# Patient Record
Sex: Male | Born: 1970
Health system: Southern US, Community
[De-identification: ages and names within clinical notes are randomized; demographics above are authoritative.]

## PROBLEM LIST (undated history)

## (undated) DIAGNOSIS — F17200 Nicotine dependence, unspecified, uncomplicated: Secondary | ICD-10-CM

## (undated) DIAGNOSIS — Z992 Dependence on renal dialysis: Secondary | ICD-10-CM

## (undated) DIAGNOSIS — E785 Hyperlipidemia, unspecified: Secondary | ICD-10-CM

## (undated) DIAGNOSIS — E119 Type 2 diabetes mellitus without complications: Secondary | ICD-10-CM

## (undated) DIAGNOSIS — K219 Gastro-esophageal reflux disease without esophagitis: Secondary | ICD-10-CM

## (undated) DIAGNOSIS — Z923 Personal history of irradiation: Secondary | ICD-10-CM

## (undated) DIAGNOSIS — K769 Liver disease, unspecified: Secondary | ICD-10-CM

## (undated) DIAGNOSIS — N189 Chronic kidney disease, unspecified: Secondary | ICD-10-CM

## (undated) DIAGNOSIS — I10 Essential (primary) hypertension: Secondary | ICD-10-CM

## (undated) DIAGNOSIS — F99 Mental disorder, not otherwise specified: Secondary | ICD-10-CM

## (undated) DIAGNOSIS — J410 Simple chronic bronchitis: Secondary | ICD-10-CM

## (undated) DIAGNOSIS — F209 Schizophrenia, unspecified: Secondary | ICD-10-CM

## (undated) HISTORY — DX: Hyperlipidemia, unspecified: E78.5

## (undated) HISTORY — DX: Liver disease, unspecified: K76.9

## (undated) HISTORY — DX: Type 2 diabetes mellitus without complications: E11.9

## (undated) HISTORY — DX: Essential (primary) hypertension: I10

## (undated) HISTORY — DX: Chronic kidney disease, unspecified: N18.9

---

## 2003-05-28 ENCOUNTER — Inpatient Hospital Stay (HOSPITAL_COMMUNITY): Admission: EM | Admit: 2003-05-28 | Discharge: 2003-05-29 | Payer: Self-pay | Admitting: Emergency Medicine

## 2003-05-31 ENCOUNTER — Encounter: Admission: RE | Admit: 2003-05-31 | Discharge: 2003-08-29 | Payer: Self-pay | Admitting: Family Medicine

## 2004-08-18 ENCOUNTER — Emergency Department (HOSPITAL_COMMUNITY): Admission: EM | Admit: 2004-08-18 | Discharge: 2004-08-18 | Payer: Self-pay | Admitting: Emergency Medicine

## 2005-03-03 ENCOUNTER — Encounter: Admission: RE | Admit: 2005-03-03 | Discharge: 2005-05-17 | Payer: Self-pay | Admitting: Family Medicine

## 2010-06-08 ENCOUNTER — Encounter: Payer: Self-pay | Admitting: Family Medicine

## 2011-03-03 ENCOUNTER — Other Ambulatory Visit (HOSPITAL_COMMUNITY): Payer: Self-pay

## 2011-03-27 ENCOUNTER — Ambulatory Visit (HOSPITAL_COMMUNITY): Payer: Self-pay | Admitting: Psychology

## 2012-06-01 ENCOUNTER — Telehealth (HOSPITAL_COMMUNITY): Payer: Self-pay

## 2012-06-01 NOTE — Telephone Encounter (Signed)
06/01/12 Recd called from [place]-pt's mother stating that she had legal guardianship of the patient - per the pt's mother he was seen @ [facility] for a long time but on yesterday was told that [facility] no longer accept his insurance.  Pt's mother has Power of Oneta Rack - informed her to bring all legal documents.   Mailed the NP paperwork and appt card./sh

## 2012-06-28 ENCOUNTER — Ambulatory Visit (INDEPENDENT_AMBULATORY_CARE_PROVIDER_SITE_OTHER): Payer: 59 | Admitting: Psychiatry

## 2012-06-28 ENCOUNTER — Encounter (HOSPITAL_COMMUNITY): Payer: Self-pay | Admitting: Psychiatry

## 2012-06-28 VITALS — BP 122/91 | HR 92 | Wt 230.8 lb

## 2012-06-28 DIAGNOSIS — Z79899 Other long term (current) drug therapy: Secondary | ICD-10-CM

## 2012-06-28 DIAGNOSIS — F259 Schizoaffective disorder, unspecified: Secondary | ICD-10-CM

## 2012-06-28 NOTE — Progress Notes (Signed)
Patient ID: Kevin Deleon, male   DOB: Oct 05, 1970, 42 y.o.   MRN: [id number] Chief complaint I have schizoaffective disorder and I want to continue my medication .    History presenting illness Patient is 42 year old Caucasian single unemployed male who came for his appointment.  Patient was seeing Dr. Lovena Le at [facility] until recently find out that his insurance does not cover.  Mother endorse that she was not happy with [facility] anyway I like to change his psychiatrist.  Patient came with his mother who provided most of the information.  Patient is a poor historian.  As per mother patient has a long history of psychiatric illness started at age 20.  He was hospitalized due to psychosis and serious anger issues.  His been seeing at [facility] mental health for past 20 years however there has history of noncompliance with medication resulting at least 4-5 psychiatric admission.  His last psychiatric admission was at old [facility] in October 2012 due to noncompliance with medication resulting in severe psychosis paranoia and hallucination and disorganized thinking.  Patient is taking Risperdal 2 mg from [place].  He was taking 6 mg when he was discharged from old [facility] however it was making him zombie and psychiatrist at [facility] decreased to 2 mg.  As per mother but he takes his medication he does very well.  Patient denies any recent hallucination or any severe mood swing but admitted social isolation, paranoia in public and sometime poor sleep.  Patient denies any side effects of Risperdal however he was note is tapping his foot and sometime restless.  Patient does not want to change his dosage, medication or frequency.  He is also not interested in any counseling or therapy.  His mother lives in [place] and comes once a month for his appointment.  Mother endorse that patient has significant anger problem with his neighbor last year however since patient moved to a new  place 6 months ago his anger is much control.  Patient told that his anger towards his neighbor was do to neighbor drinking alcohol.  Patient denies any recent agitation anger or any mood swing.  He takes his medication however mother endorse that he needs reminders. Mother is also not happy with his primary care physician.  She is in the process of changing his primary care physician and has appointment with endocrinologist tomorrow.  Past psychiatric history Patient started to have psychiatric illness at age 8.  At that time he was hospitalized due to paranoia and disorganized thinking.  Though he denies any is suicidal attempt but admitted significant mood swings anger and hallucination in the past.  He has at least 5 psychiatric hospitalization mostly due to noncompliance of medication and disorganized thinking.  In the past he has taken lithium which was discontinued due to kidney function issues.  He was seeing [facility] for many years until recently changed to [facility].  In the past he had tried Haldol which he actually likes but when the dose was increased to 15 mg he became zombie and stopped.  He likes Risperdal.  In the past he also tried amitriptyline by Dr. Milagros Loll.  Patient has history of mania and psychosis.  Psychosocial history Patient was born in [place] and raised in [place].  His living in [place] since age 72.  Patient is never married.  He has no children.  Patient lives by himself in her apartment.  His mother lives in  [place] with her partner.  Patient has a sister however patient does not get along with her.  Patient has no knowledge about her biological father who he believed lives in [place].  Patient told that his father disowned him.  Patient denies any history of physical sexual verbal or emotional abuse.  Family history Patient endorse uncle has substance use problem.  Education work history Patient has some college  education.  He was working in a Architect until 5 years ago.  He stopped working due to his anger problem.  Alcohol and substance use history Patient admitted history of heavy drinking in his teens.  He has DWI in 1993.  He has used marijuana in the past but denies any current use.  Patient denies drinking however mother does that he had drink on New Year's Eve .  Patient denies any binge drinking.  Medical history Patient has obesity, diabetes mellitus, hypertension and hyperlipidemia.  His primary care physician is Dr. Orpah Greek.  Review of Systems  Constitutional: Negative.   Gastrointestinal: Negative.   Musculoskeletal: Negative.   Neurological: Negative.   Psychiatric/Behavioral: Positive for hallucinations and memory loss. The patient is nervous/anxious.    Mental status examination Patient is mildly obese male who is casually dressed and fairly groomed.  He maintained poor eye contact.  He is superficially cooperative.  He is guarded about his past history and symptoms.  His his speech is pressured and fixated on certain issues.  At times he is rambling and incoherent with increased volume and tone.  His thought processes circumstantial however there were no flight of ideas or any loose association.  He endorse paranoia but does not want to talk in detail about his paranoia.  He does not feel comfortable in public.  He denies any active or passive suicidal thoughts or homicidal thoughts.  He denies any auditory or visual hallucination.  There were no obsession present at this time.  He was noted restless and his psychomotor activity is slightly increased.  He described his mood is okay and his affect is irritable.  His attention and concentration is poor.  He's alert and oriented x3.  His insight judgment and impulse control is fair.  Assessment Axis I schizoaffective disorder bipolar type Axis II deferred Axis III see medical history Axis IV mild to moderate Axis V 50-55  Plan I  talked with the patient about his symptoms, medication and response to the medication.  We talk about psychosocial stressors including his medical history and noncompliance of medication in the past.  Patient remains very guarded and does not want to change his medication dosage or frequency.  He also declined any counseling and therapy at this time.  His mother is very involved in his treatment plan comes once a month from [place].  At this time I will continue Risperdal 2 mg as given by [facility] and patient is still has 2 months worth of medication.  I offer Cogentin to help his restlessness but patient refused.  Patient also refused injectable Risperdal 2 .  Risk and benefit explain in detail.  Recommend to call us if he has any question or concern if he feel worsening of the symptom.  Safety plan discussed that anytime having active suicidal thoughts or homicidal thoughts that he need to call 911 or go to local emergency room.  Patient is scheduled to see endocrinologist tomorrow we will get blood work results.  I will see him again in 4 weeks.  Time spent 60 minutes.

## 2012-07-05 ENCOUNTER — Telehealth (HOSPITAL_COMMUNITY): Payer: Self-pay | Admitting: *Deleted

## 2012-07-05 NOTE — Telephone Encounter (Signed)
She has guardianship, per mother, as of fall 2012,and she asked that this information be given to provider.She states in past, she wrote letters to provider, but that now she has guardianship, she can communicate directly. Mother called with concerns related to patient appt with provider last week. Asked questions about other providers in office, to see if possible for patient to see another MD. Informed her that patients were not usually transferred between providers per clinic policy.  Most of phone call was mother explaining how previous providers have interacted with "Lissa Hoard", and what approach has worked with him in the past. Mother states he does best with a conversational approach, used by his previous provider, instead of direct questions.  She states he does not like or respond well to what she referred to as scripted questions, such as - Are you hearing voices?; Do you have any thoughts of hurting yourself? Patient does not like to come to MD appointments, so mother comes with him. He will always say he is okay in answer to MD questions. He does not like for his mother to bring up or say anything negative in appointments, so she tries not to.  She states he becomes agitated when when therapy, additional medicines or suggestion of changes in medicine is made, so she would prefer if those things are not mentioned. States he does not want to go to therapy at all, ever. She also states he is doing okay on current medication, in past has not taken medicine as ordered, but will take this one.She states she thought he did better on Haldol in past, but patient did not like it and has since refused to take it, so she does not bring it up.  Mother states she lives in [place], but is in process of setting up home health visits for patient, to monitor his blood pressure and blood sugars and to have someone fill his medicine boxes, but she will continue to come to [place] to come to appts in this  clinic.

## 2012-07-26 ENCOUNTER — Ambulatory Visit (HOSPITAL_COMMUNITY): Payer: Self-pay | Admitting: Psychiatry

## 2012-08-09 ENCOUNTER — Ambulatory Visit (INDEPENDENT_AMBULATORY_CARE_PROVIDER_SITE_OTHER): Payer: 59 | Admitting: Psychiatry

## 2012-08-09 ENCOUNTER — Encounter (HOSPITAL_COMMUNITY): Payer: Self-pay | Admitting: Psychiatry

## 2012-08-09 VITALS — BP 113/78 | HR 90 | Wt 231.4 lb

## 2012-08-09 DIAGNOSIS — E785 Hyperlipidemia, unspecified: Secondary | ICD-10-CM | POA: Insufficient documentation

## 2012-08-09 DIAGNOSIS — F259 Schizoaffective disorder, unspecified: Secondary | ICD-10-CM

## 2012-08-09 DIAGNOSIS — I10 Essential (primary) hypertension: Secondary | ICD-10-CM | POA: Insufficient documentation

## 2012-08-09 DIAGNOSIS — E669 Obesity, unspecified: Secondary | ICD-10-CM | POA: Insufficient documentation

## 2012-08-09 DIAGNOSIS — E119 Type 2 diabetes mellitus without complications: Secondary | ICD-10-CM | POA: Insufficient documentation

## 2012-08-09 NOTE — Progress Notes (Signed)
Kevin Deleon  JAHLIL Deleon [id number] 42 y.o.  08/09/2012 2:57 PM  Chief Complaint: I'm taking the medication regularly.  History of Present Illness: Patient is 42 year old Caucasian single unemployed man who came for his followup appointment.  He came with his mother who drove from [place] for his appointment.  Patient was seen first time on 06/28/2012.  Patient is a long history of schizoaffective disorder.  He was seen Dr. Lovena Le at [facility] however due to recent change in his insurance he has to find a different psychiatrist.  Patient remains guarded and withdrawn.  Most of the information was obtained from his mother.  However patient reported that he's been taking his medication regularly.  His mother is trying to help him by getting more services.  Now patient is seen visiting nurse every week and a visiting nurse practitioner every month for medication management.  Patient has not seen his primary care physician Dr. Carole Binning.  As per mother this office as a walk-in office in one day when patient went to see him with the mother, his office was closed.  Patient is comfortable with visiting nurse practitioner.  He has recently blood work and he was told that his creatinine is high however we do not have blood results available.  Mother is also happy with the nurse practitioner.  Patient denies any recent agitation anger mood swing.  He stays to himself most of the time.  He does not leave his place unless necessary.  He sleeping better.  He is taking Risperdal 2 mg at bedtime.  Patient has no issues with his neighbor.  He's not drinking or using any illegal substance.  Suicidal Ideation: No Plan Formed: No Patient has means to carry out plan: No  Homicidal Ideation: No Plan Formed: No Patient has means to carry out plan: No  Review of Systems: Psychiatric: Agitation: No Hallucination: No Depressed Mood: No Insomnia: No Hypersomnia: No Altered  Concentration: No Feels Worthless: No Grandiose Ideas: Yes Belief In Special Powers: No New/Increased Substance Abuse: No Compulsions: No  Neurologic: Headache: No Seizure: No Paresthesias: No  Medical History:  Patient has history of obesity, diabetes mellitus, hypertension and hyperlipidemia.  He is seeing visiting nurse practitioner.  Psychosocial history. Patient was born in [place] and raised in [place].  He is living in [place] since age 14.  Patient never married.  He has no children.  Patient lives by himself in his apartment.  His mother lives in [place] with her partner.  Patient has a sister however patient does not get along with her.  Patient has no knowledge about his biological father patient told that his father disowned him.  Alcohol and substance use history. Patient admitted history of heavy drinking in his teens.  He has DWI in 1983.  He has used marijuana in the past but denies any current use.  Education work history. Patient has some college education.  He was working in Architect until 5 years ago.  He stopped working due to his anger problem.  Outpatient Encounter Prescriptions as of 08/09/2012  Medication Sig Dispense Refill  . LANTUS SOLOSTAR 100 UNIT/ML injection       . lisinopril-hydrochlorothiazide (PRINZIDE,ZESTORETIC) 20-12.5 MG per tablet       . metFORMIN (GLUCOPHAGE) 1000 MG tablet Take 1,000 mg by mouth 2 (two) times daily.      . risperiDONE (RISPERDAL) 2 MG tablet Take 2 mg by mouth at bedtime.      Marland Kitchen  simvastatin (ZOCOR) 20 MG tablet Take 20 mg by mouth daily.       No facility-administered encounter medications on file as of 08/09/2012.    Past Psychiatric History/Hospitalization(s): Patient started to have psychiatric illness at age 67. At that time he was hospitalized due to paranoia and disorganized thinking. Though he denies any is suicidal attempt but admitted significant mood swings anger and hallucination  in the past. He has at least 5 psychiatric hospitalization mostly due to noncompliance of medication and disorganized thinking. In the past he has taken lithium which was discontinued due to kidney function issues. He was seeing [facility] for many years until recently changed to [facility]. In the past he had tried Haldol which he actually likes but when the dose was increased to 15 mg he became zombie and stopped. He likes Risperdal. In the past he also tried amitriptyline by Dr. Milagros Loll. Patient has history of mania and psychosis.   Anxiety: No Bipolar Disorder: Yes Depression: Yes Mania: Yes Psychosis: Yes Schizophrenia: Yes Personality Disorder: No Hospitalization for psychiatric illness: He has at least 5 psychiatric hospitalization.  His last hospitalization was in October 2012 at old [facility] History of Electroconvulsive Shock Therapy: No Prior Suicide Attempts: No  Physical Exam: Constitutional:  BP 113/78  Pulse 90  Wt 231 lb 6.4 oz (104.962 kg)  General Appearance: well nourished, obese and Fairly groomed.  He maintained poor eye contact.  He is superficially cooperative and guarded.  Musculoskeletal: Strength & Muscle Tone: within normal limits Gait & Station: normal Patient leans: N/A  Psychiatric: Speech (describe rate, volume, coherence, spontaneity, and abnormalities if any): Pressured with increase pitch and tone.  Thought Process (describe rate, content, abstract reasoning, and computation): Circumstantial however there were no flight of ideas or any loose association.  Associations: Relevant  Thoughts: Guarded, paranoia but no delusion or hallucination.  Mental Status: Orientation: oriented to person, place, time/date and situation Mood & Affect: anxiety and Irritable. Attention Span & Concentration: Fair  Medical Decision Making (Choose Three): Established Problem, Stable/Improving (1), Review of Psycho-Social Stressors (1),  Review or order clinical lab tests (1), Review and summation of old records (2), Review of Last Therapy Session (1), Review of Medication Regimen & Side Effects (2) and Review of New Medication or Change in Dosage (2)  Assessment: Axis I: Schizoaffective disorder bipolar type  Axis II: Deferred  Axis III: See medical history  Axis IV: Mild to moderate  Axis V: 50-55   Plan: Will contact his visiting nurse practitioner for more records especially recent blood work.  Mother does that patient has high creatinine however we do not have the results available.  I encouraged him to walk regularly and go outside and try to do exercise.  Recommend watching his calorie intake.  Patient does not want counseling at this time.  He recently received a 90 day supply office Risperdal from his [facility].  He does not need a new prescription.  However I recommend that he should call us if he is any question or concern feel worsening of the symptom.  Risk and benefit explain.  Safety plan discussed.  I will see him again in 2 months.  Time spent 25 minutes.  More than 50% of the time spent and psychoeducation counseling and coordination of care.  ARFEEN,SYED T., MD 08/09/2012

## 2012-09-20 ENCOUNTER — Telehealth (HOSPITAL_COMMUNITY): Payer: Self-pay

## 2012-09-20 NOTE — Telephone Encounter (Signed)
09/20/12 9:10AM Patient's mother called in reference to a bill the call was transferred to [place] in the insurance dept./sh

## 2012-10-11 ENCOUNTER — Ambulatory Visit (INDEPENDENT_AMBULATORY_CARE_PROVIDER_SITE_OTHER): Payer: 59 | Admitting: Psychiatry

## 2012-10-11 ENCOUNTER — Encounter (HOSPITAL_COMMUNITY): Payer: Self-pay | Admitting: Psychiatry

## 2012-10-11 VITALS — BP 122/88 | HR 105 | Ht 71.0 in | Wt 234.0 lb

## 2012-10-11 DIAGNOSIS — N184 Chronic kidney disease, stage 4 (severe): Secondary | ICD-10-CM

## 2012-10-11 DIAGNOSIS — F259 Schizoaffective disorder, unspecified: Secondary | ICD-10-CM

## 2012-10-11 NOTE — Progress Notes (Signed)
[facility] Note  Kevin Deleon JD:[id number] 42 y.o.  10/11/2012 3:03 PM  Chief Complaint:  I have kidney disease.  I'm seeing nephrologist .    History of Present Illness: Patient is 42 year old Caucasian single unemployed man who came for his followup appointment with his mother.  Patient is seeing [facility]rologist at [facility].  He is diagnosed with stage IV kidney disease.  His creatinine is high.  His medication for blood pressure and diabetes is changed.  He is no longer taking metformin and lisinopril.  He is now taking glipizide and Norvasc .  He is seeing [place] and nephrologist at [facility].  His weight remains unchanged from the past.  His mother is concerned about his obesity.  Patient is complying with the Risperdal.  He denies any recent agitation anger or mood swing.  He stays to himself and does not leave his place is necessary.  Patient denies any complaining of medication.  He denies any tremors or shakes.  He likes his medication and does not want to change it.  He denies any recent paranoia and hallucination or any severe mood swing.  I review his records from his nephrologist.  His creatinine is 3.3.  He is also prescribed vitamin D due to low vitamin D levels .  Patient remains guarded and withdrawn.  Most of the information was obtained from his mother.  He is seeing primary care physician will make house visits .  Patient also see visiting nurse practitioner and a comfortable with.  Patient is now drinking or using any illegal substance.  Patient denies any recent issues with his neighbors.    Suicidal Ideation: No Plan Formed: No Patient has means to carry out plan: No  Homicidal Ideation: No Plan Formed: No Patient has means to carry out plan: No  Review of Systems: Psychiatric: Agitation: No Hallucination: No Depressed Mood: No Insomnia: No Hypersomnia: No Altered Concentration: No Feels Worthless: No Grandiose Ideas: Yes Belief In  Special Powers: No New/Increased Substance Abuse: No Compulsions: No  Neurologic: Headache: No Seizure: No Paresthesias: No  Medical History:  Patient has history of obesity, diabetes mellitus, hypertension and hyperlipidemia.  He is seeing visiting nurse practitioner.  Psychosocial history. Patient was born in [place] and raised in [place].  He is living in [place] since age 15.  Patient never married.  He has no children.  Patient lives by himself in his apartment.  His mother lives in [place] with her partner.  Patient has a sister however patient does not get along with her.  Patient has no knowledge about his biological father patient told that his father disowned him.  Alcohol and substance use history. Patient admitted history of heavy drinking in his teens.  He has DWI in 1983.  He has used marijuana in the past but denies any current use.  Education work history. Patient has some college education.  He was working in [employer] until 5 years ago.  He stopped working due to his anger problem.  Outpatient Encounter Prescriptions as of 10/11/2012  Medication Sig Dispense Refill  . amLODipine (NORVASC) 10 MG tablet Take 10 mg by mouth daily.      Marland Kitchen glipiZIDE (GLUCOTROL) 10 MG tablet Take 10 mg by mouth 2 (two) times daily before a meal.      . LANTUS SOLOSTAR 100 UNIT/ML injection       . risperiDONE (RISPERDAL) 2 MG tablet Take 2 mg by mouth at bedtime.      Marland Kitchen  simvastatin (ZOCOR) 20 MG tablet Take 20 mg by mouth daily.      . [DISCONTINUED] lisinopril-hydrochlorothiazide (PRINZIDE,ZESTORETIC) 20-12.5 MG per tablet       . [DISCONTINUED] metFORMIN (GLUCOPHAGE) 1000 MG tablet Take 1,000 mg by mouth 2 (two) times daily.       No facility-administered encounter medications on file as of 10/11/2012.    Past Psychiatric History/Hospitalization(s): Patient started to have psychiatric illness at age 61. At that time he was hospitalized due to paranoia and  disorganized thinking. Though he denies any is suicidal attempt but admitted significant mood swings anger and hallucination in the past. He has at least 5 psychiatric hospitalization mostly due to noncompliance of medication and disorganized thinking. In the past he has taken lithium which was discontinued due to kidney function issues. He was seeing [facility] for many years until recently changed to [place]. In the past he had tried Haldol which he actually likes but when the dose was increased to 15 mg he became zombie and stopped. He likes Risperdal. In the past he also tried amitriptyline by Dr. Milagros Loll. Patient has history of mania and psychosis.   Anxiety: No Bipolar Disorder: Yes Depression: Yes Mania: Yes Psychosis: Yes Schizophrenia: Yes Personality Disorder: No Hospitalization for psychiatric illness: He has at least 5 psychiatric hospitalization.  His last hospitalization was in October 2012 at old [facility] History of Electroconvulsive Shock Therapy: No Prior Suicide Attempts: No  Physical Exam: Constitutional:  BP 122/88  Pulse 105  Ht 5\' 11"  (1.803 m)  Wt 234 lb (106.142 kg)  BMI 32.65 kg/m2  General Appearance: well nourished, obese and Fairly groomed.  He maintained fair eye contact.  He is superficially cooperative and guarded.  Musculoskeletal: Strength & Muscle Tone: within normal limits Gait & Station: normal Patient leans: N/A  Psychiatric: Speech (describe rate, volume, coherence, spontaneity, and abnormalities if any): Pressured with increase pitch and tone.  Thought Process (describe rate, content, abstract reasoning, and computation): Circumstantial however there were no flight of ideas or any loose association.  Associations: Relevant  Thoughts: Guarded, paranoia but no delusion or hallucination.  Mental Status: Orientation: oriented to person, place, time/date and situation Mood & Affect: anxiety and  Irritable. Attention Span & Concentration: Fair  Medical Decision Making (Choose Three): Established Problem, Stable/Improving (1), Review of Psycho-Social Stressors (1), Review or order clinical lab tests (1), Review and summation of old records (2), Review of Last Therapy Session (1), Review of Medication Regimen & Side Effects (2) and Review of New Medication or Change in Dosage (2)  Assessment: Axis I: Schizoaffective disorder bipolar type  Axis II: Deferred  Axis III:  Patient Active Problem List   Diagnosis Date Noted  . Chronic kidney disease (CKD), stage IV (severe) 10/11/2012  . Obesity, unspecified 08/09/2012  . Diabetes mellitus 08/09/2012  . HTN (hypertension) 08/09/2012  . Hyperlipidemia 08/09/2012    Axis IV: Mild to moderate  Axis V: 50-55   Plan:  I called [facility] and get the records regarding his nephrology visits.  I review the blood results.  His creatinine is 3.3.  She's taking glipizide and Norvasc .  He is taking vitamin D 400 units every day.  Patient has refills remaining on his Risperdal.  Recommend to reduce weight to help his hypertension and chronic renal disease.  Patient does not need any new prescription of Risperdal since he already has refill remaining from his previous psychiatrist.  At this time patient does not have  any side effects including any tremors or shakes.  I will continue his current psychiatric medication.  Reassurance given.  Discuss treatment plan with the patient and his mother. Recommend to call us if he is any question or concern feel worsening of the symptom.  Risk and benefit explain.  Safety plan discussed.  I will see him again in 2 months.  Time spent 25 minutes.  More than 50% of the time spent and psychoeducation counseling and coordination of care.  Landen Knoedler T., MD 10/11/2012

## 2012-12-14 ENCOUNTER — Ambulatory Visit (INDEPENDENT_AMBULATORY_CARE_PROVIDER_SITE_OTHER): Payer: 59 | Admitting: Psychiatry

## 2012-12-14 ENCOUNTER — Encounter (HOSPITAL_COMMUNITY): Payer: Self-pay | Admitting: Psychiatry

## 2012-12-14 VITALS — BP 136/95 | HR 105 | Ht 69.69 in | Wt 228.6 lb

## 2012-12-14 DIAGNOSIS — F259 Schizoaffective disorder, unspecified: Secondary | ICD-10-CM

## 2012-12-14 MED ORDER — RISPERIDONE 2 MG PO TABS: 2.0000 mg | ORAL_TABLET | Freq: Every day | ORAL | Status: DC
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
## 2013-02-27 ENCOUNTER — Other Ambulatory Visit (HOSPITAL_COMMUNITY): Payer: Self-pay | Admitting: Psychiatry

## 2013-03-01 ENCOUNTER — Other Ambulatory Visit (HOSPITAL_COMMUNITY): Payer: Self-pay | Admitting: Psychiatry

## 2013-03-01 ENCOUNTER — Telehealth (HOSPITAL_COMMUNITY): Payer: Self-pay | Admitting: *Deleted

## 2013-03-01 DIAGNOSIS — F259 Schizoaffective disorder, unspecified: Secondary | ICD-10-CM

## 2013-03-01 MED ORDER — RISPERIDONE 2 MG PO TABS: 2.0000 mg | ORAL_TABLET | Freq: Every day | ORAL | Status: DC

## 2013-03-01 NOTE — Telephone Encounter (Signed)
[id number] request. Mother states [facility] sent refill request to [facility] and was informed it was returned denied.Mother states it takes 2 weeks for medicine to arrive by [facility], so needs to be ordered this far in advance.Last ordered 12/14/12.Next appt 11/4.

## 2013-03-21 ENCOUNTER — Encounter (HOSPITAL_COMMUNITY): Payer: Self-pay | Admitting: Psychiatry

## 2013-03-21 ENCOUNTER — Encounter (INDEPENDENT_AMBULATORY_CARE_PROVIDER_SITE_OTHER): Payer: Self-pay

## 2013-03-21 ENCOUNTER — Ambulatory Visit (INDEPENDENT_AMBULATORY_CARE_PROVIDER_SITE_OTHER): Payer: 59 | Admitting: Psychiatry

## 2013-03-21 VITALS — BP 129/94 | HR 122 | Ht 69.69 in | Wt 224.0 lb

## 2013-03-21 DIAGNOSIS — F259 Schizoaffective disorder, unspecified: Secondary | ICD-10-CM

## 2013-03-21 MED ORDER — RISPERIDONE 2 MG PO TABS: 2.0000 mg | ORAL_TABLET | Freq: Every day | ORAL | Status: DC

## 2013-03-21 NOTE — Progress Notes (Signed)
[facility] Deleon Note   PASHA Deleon JD:[id number] 42 y.o.  03/21/2013 2:56 PM  Chief Complaint:  Medication management and followup.    History of Present Illness: Patient is 42 year old Caucasian single unemployed man who came for his followup appointment with his mother.  Patient is compliant with risperidone 2 mg .  He denies any side effects including any tremors or shakes.  [facility] was 3.60.  His BUN was 34 .  Mother endorsed that nephrologist is talked about dialysis and may need fistula .  Patient is doing better on his medication.  He denies any agitation anger or any mood swing.  He is not involve any altercation with the neighbors.  He usually stay away from his neighbors .  He is sleeping better.  He denies any paranoia or any artery or visual hallucination. Patient is not drinking or using any illegal substance.  Suicidal Ideation: No Plan Formed: No Patient has means to carry out plan: No  Homicidal Ideation: No Plan Formed: No Patient has means to carry out plan: No  Review of Systems: Psychiatric: Agitation: No Hallucination: No Depressed Mood: No Insomnia: No Hypersomnia: No Altered Concentration: No Feels Worthless: No Grandiose Ideas: No Belief In Special Powers: No New/Increased Substance Abuse: No Compulsions: No  Neurologic: Headache: No Seizure: No Paresthesias: No  Medical History:  Patient has history of obesity, diabetes mellitus, hypertension, stage IV renal insufficiency and hyperlipidemia.  He is seeing visiting nurse practitioner.  Psychosocial history. Patient was born in [place] and raised in [place].  He is living in [place] since age 76.  Patient never married.  He has no children.  Patient lives by himself in his apartment.  His mother lives in [place] with her partner.  Patient has a sister however patient does not get along  with her.  Patient has no knowledge about his biological father patient told that his father disowned him.  Alcohol and substance use history. Patient admitted history of heavy drinking in his teens.  He has DWI in 1983.  He has used marijuana in the past but denies any current use.  Education work history. Patient has some college education.  He was working in [employer] until 5 years ago.  He stopped working due to his anger problem.  Outpatient Encounter Prescriptions as of 03/21/2013  Medication Sig  . amLODipine (NORVASC) 10 MG tablet Take 10 mg by mouth daily.  Marland Kitchen glipiZIDE (GLUCOTROL) 10 MG tablet Take 10 mg by mouth 2 (two) times daily before a meal.  . LANTUS SOLOSTAR 100 UNIT/ML injection   . risperiDONE (RISPERDAL) 2 MG tablet Take 1 tablet (2 mg total) by mouth at bedtime.  . simvastatin (ZOCOR) 20 MG tablet Take 20 mg by mouth daily.  . [DISCONTINUED] risperiDONE (RISPERDAL) 2 MG tablet Take 1 tablet (2 mg total) by mouth at bedtime.    Past Psychiatric History/Hospitalization(s): Patient started to have psychiatric illness at age 6. At that time he was hospitalized due to paranoia and disorganized thinking. Though he denies any is suicidal attempt but admitted significant mood swings anger and hallucination in the past. He has at least 5 psychiatric hospitalization mostly due to noncompliance of medication and disorganized thinking. In the past he has taken lithium which was discontinued due to kidney function issues. He was seeing [facility] for many years until recently changed to [place]. In the past  he had tried Haldol which he actually likes but when the dose was increased to 15 mg he became zombie and stopped. He likes Risperdal. In the past he also tried amitriptyline by Dr. Milagros Loll. Patient has history of mania and psychosis.   Anxiety: No Bipolar Disorder: Yes Depression: Yes Mania: Yes Psychosis: Yes Schizophrenia: Yes Personality Disorder:  No Hospitalization for psychiatric illness: He has at least 5 psychiatric hospitalization.  His last hospitalization was in October 2012 at old [facility] History of Electroconvulsive Shock Therapy: No Prior Suicide Attempts: No  Physical Exam: Constitutional:  BP 129/94  Pulse 122  Ht 5' 9.69" (1.77 m)  Wt 224 lb (101.606 kg)  BMI 32.43 kg/m2  General Appearance: well nourished, obese and Fairly groomed.  He maintained fair eye contact.  He is superficially cooperative and guarded.  Musculoskeletal: Strength & Muscle Tone: within normal limits Gait & Station: normal Patient leans: N/A  Psychiatric: Speech (describe rate, volume, coherence, spontaneity, and abnormalities if any): Pressured with increase pitch and tone.  Thought Process (describe rate, content, abstract reasoning, and computation): Logical and goal directed.    Associations: Relevant  Thoughts: Guarded, paranoia but no delusion or hallucination.  Mental Status: Orientation: oriented to person, place, time/date and situation Mood & Affect: anxiety and Irritable. Attention Span & Concentration: Fair  Medical Decision Making (Choose Three): Established Problem, Stable/Improving (1), Review of Psycho-Social Stressors (1), Review of Last Therapy Session (1) and Review of Medication Regimen & Side Effects (2)  Assessment: Axis I: Schizoaffective disorder bipolar type  Axis II: Deferred  Axis III:  Patient Active Problem List   Diagnosis Date Noted  . Chronic kidney disease (CKD), stage IV (severe) 10/11/2012  . Obesity, unspecified 08/09/2012  . Diabetes mellitus 08/09/2012  . HTN (hypertension) 08/09/2012  . Hyperlipidemia 08/09/2012    Axis IV: Mild to moderate  Axis V: 50-55   Plan:  I will continue risperidone 2 mg at present dose.  I reviewed his blood work results.  His creatinine is 3.60 which was done on September 15 , 2014.  I recommend to have his blood work faxed to [place].  I recommend  to call us back it is a question of concern or if he feels worsening of her symptoms.  I will see him again in 3 months.  Yancy Hascall T., MD 03/21/2013

## 2013-03-30 ENCOUNTER — Other Ambulatory Visit: Payer: Self-pay | Admitting: *Deleted

## 2013-03-30 DIAGNOSIS — N184 Chronic kidney disease, stage 4 (severe): Secondary | ICD-10-CM

## 2013-03-30 DIAGNOSIS — Z01810 Encounter for preprocedural cardiovascular examination: Secondary | ICD-10-CM

## 2013-05-02 ENCOUNTER — Other Ambulatory Visit (HOSPITAL_COMMUNITY): Payer: Self-pay

## 2013-05-02 ENCOUNTER — Ambulatory Visit: Payer: Self-pay | Admitting: Vascular Surgery

## 2013-05-02 ENCOUNTER — Encounter (HOSPITAL_COMMUNITY): Payer: Self-pay

## 2013-05-04 ENCOUNTER — Encounter: Payer: Self-pay | Admitting: Vascular Surgery

## 2013-05-05 ENCOUNTER — Ambulatory Visit (HOSPITAL_COMMUNITY)
Admission: RE | Admit: 2013-05-05 | Discharge: 2013-05-05 | Disposition: A | Discharge status: Home / Self Care | Payer: Medicare Other | Source: Ambulatory Visit | Attending: Vascular Surgery | Admitting: Vascular Surgery

## 2013-05-05 ENCOUNTER — Encounter: Payer: Self-pay | Admitting: Vascular Surgery

## 2013-05-05 ENCOUNTER — Ambulatory Visit (INDEPENDENT_AMBULATORY_CARE_PROVIDER_SITE_OTHER)
Admission: RE | Admit: 2013-05-05 | Discharge: 2013-05-05 | Disposition: A | Discharge status: Home / Self Care | Payer: Medicare Other | Source: Ambulatory Visit | Attending: Vascular Surgery | Admitting: Vascular Surgery

## 2013-05-05 ENCOUNTER — Ambulatory Visit (INDEPENDENT_AMBULATORY_CARE_PROVIDER_SITE_OTHER): Payer: Medicare Other | Admitting: Vascular Surgery

## 2013-05-05 VITALS — BP 137/93 | HR 96 | Resp 18 | Ht 71.0 in | Wt 229.0 lb

## 2013-05-05 DIAGNOSIS — N184 Chronic kidney disease, stage 4 (severe): Secondary | ICD-10-CM

## 2013-05-05 DIAGNOSIS — Z01810 Encounter for preprocedural cardiovascular examination: Secondary | ICD-10-CM | POA: Insufficient documentation

## 2013-05-05 NOTE — Progress Notes (Signed)
[facility] [place]  Referred by:  Estanislado Emms, MD [address]                          [place], [place] [address]  Reason for referral: New access  History of Present Illness  Kevin Deleon is a 42 y.o. (24-Apr-1971) male who presents for evaluation for permanent access.  The patient is right hand dominant.  The patient has not had previous access procedures.  Previous central venous cannulation procedures include: none.  The patient has never had a PPM placed.  Patient is being told he will need HD within next 6 months.  Past Medical History  Diagnosis Date  . HTN (hypertension)   . Diabetes mellitus   . Hyperlipidemia   . Liver disease   . Chronic kidney disease     History reviewed. No pertinent past surgical history.  History   Social History  . Marital Status: Single    Spouse Name: N/A    Number of Children: N/A  . Years of Education: N/A   Occupational History  . Not on file.   Social History Main Topics  . Smoking status: Current Every Day Smoker    Types: Cigarettes  . Smokeless tobacco: Never Used  . Alcohol Use: No  . Drug Use: No  . Sexual Activity: Not on file   Other Topics Concern  . Not on file   Social History Narrative  . No narrative on file   Fam History: both parents alive without significant medical issues  Current Outpatient Prescriptions on File Prior to Visit  Medication Sig Dispense Refill  . amLODipine (NORVASC) 10 MG tablet Take 10 mg by mouth daily.      Marland Kitchen glipiZIDE (GLUCOTROL) 10 MG tablet Take 10 mg by mouth 2 (two) times daily before a meal.      . LANTUS SOLOSTAR 100 UNIT/ML injection       . risperiDONE (RISPERDAL) 2 MG tablet Take 1 tablet (2 mg total) by mouth at bedtime.  90 tablet  0  . simvastatin (ZOCOR) 20 MG tablet Take 20 mg by mouth daily.       No current facility-administered medications on file prior to visit.    No Known Allergies   REVIEW OF SYSTEMS:  (Positives checked otherwise  negative)  CARDIOVASCULAR:  []  chest pain, []  chest pressure, []  palpitations, []  shortness of breath when laying flat, []  shortness of breath with exertion,  []  pain in feet when walking, []  pain in feet when laying flat, []  history of blood clot in veins (DVT), []  history of phlebitis, []  swelling in legs, []  varicose veins  PULMONARY:  []  productive cough, []  asthma, []  wheezing  NEUROLOGIC:  []  weakness in arms or legs, []  numbness in arms or legs, []  difficulty speaking or slurred speech, []  temporary loss of vision in one eye, []  dizziness  HEMATOLOGIC:  []  bleeding problems, []  problems with blood clotting too easily  MUSCULOSKEL:  []  joint pain, []  joint swelling  GASTROINTEST:  []  vomiting blood, []  blood in stool     GENITOURINARY:  []  burning with urination, []  blood in urine  PSYCHIATRIC:  []  history of major depression  INTEGUMENTARY:  []  rashes, []  ulcers  CONSTITUTIONAL:  []  fever, []  chills  Physical Examination  Filed Vitals:   05/05/13 1653  BP: 137/93  Pulse: 96  Resp: 18  Height: 5\' 11"  (1.803 m)  Weight:  229 lb (103.874 kg)   Body mass index is 31.95 kg/(m^2).  General: A&O x 3, WDWN  Head: [place]/AT  Ear/Nose/Throat: Hearing grossly intact, nares w/o erythema or drainage, oropharynx w/o Erythema/Exudate, Mallampati score: 3  Eyes: PERRLA, EOMI  Neck: Supple, no nuchal rigidity, no palpable LAD  Pulmonary: Sym exp, good air movt, CTAB, no rales, rhonchi, & wheezing  Cardiac: RRR, Nl S1, S2, no Murmurs, rubs or gallops  Vascular: Vessel Right Left  Radial Palpable Palpable  Ulnar Palpable Palpable  Brachial Palpable Palpable  Carotid Palpable, without bruit Palpable, without bruit  Aorta Not palpable N/A  Femoral Palpable Palpable  Popliteal Not palpable Not palpable  PT Palpable Palpable  DP Palpable Palpable   Gastrointestinal: soft, NTND, -G/R, - HSM, - masses, - CVAT B  Musculoskeletal: M/S 5/5 throughout , Extremities without ischemic  changes   Neurologic: CN 2-12 intact , Pain and light touch intact in extremities , Motor exam as listed above  Psychiatric: Judgment intact, Mood & affect appropriate for pt's clinical situation  Dermatologic: See M/S exam for extremity exam, no rashes otherwise noted  Lymph : No Cervical, Axillary, or Inguinal lymphadenopathy   Non-Invasive Vascular Imaging  Vein Mapping  (Date: 05/05/2013):   R arm: acceptable vein conduits include R upper arm cephalic  L arm: acceptable vein conduits include R upper arm cephalic and basilic  BUE arterial duplex (05/05/2013)  Triphasic throughout both arms  Outside Studies/Documentation 4 pages of outside documents were reviewed including: outpatient nephrologic chart.  Medical Decision Making  Kevin Deleon is a 42 y.o. male who presents with chronic kidney disease stage IV  Based on vein mapping and examination, this patient's permanent access options include: R BC AVF, L BC AVF, L BVT.  I would start with L BC AVF.  I had an extensive discussion with this patient in regards to the nature of access surgery, including risk, benefits, and alternatives.    The patient is aware that the risks of access surgery include but are not limited to: bleeding, infection, steal syndrome, nerve damage, ischemic monomelic neuropathy, failure of access to mature, and possible need for additional access procedures in the future.  The patient has agreed to proceed with the above procedure which will be scheduled 7 JAN 15.  Adele Barthel, MD [facility] [place] Office: [phone] Pager: [phone]  05/05/2013, 5:19 PM

## 2013-05-08 ENCOUNTER — Encounter: Payer: Self-pay | Admitting: *Deleted

## 2013-05-08 ENCOUNTER — Other Ambulatory Visit: Payer: Self-pay | Admitting: *Deleted

## 2013-05-09 ENCOUNTER — Encounter: Payer: Self-pay | Admitting: *Deleted

## 2013-05-16 ENCOUNTER — Encounter (HOSPITAL_COMMUNITY): Payer: Self-pay | Admitting: Pharmacy Technician

## 2013-05-22 NOTE — Pre-Procedure Instructions (Signed)
Kevin Deleon  05/22/2013   Your procedure is scheduled on:  Wednesday May 24, 2013 @ 11:25 AM.  Report to [facility]trance "A" Admitting at 7:30 AM.  Call this number if you have problems the morning of surgery: [phone]   Remember:   Do not eat food or drink liquids after midnight.   Take these medicines the morning of surgery with A SIP OF WATER: Amlodipine (Norvasc)  Do NOT take any diabetic medications the morning of your surgery  Do not wear jewelry.  Do not wear lotions, powders, or colognes. You may wear deodorant.  Men may shave face and neck.  Do not bring valuables to the hospital.  [facility] is not responsible for any belongings or valuables.               Contacts, dentures or bridgework may not be worn into surgery.  Leave suitcase in the car. After surgery it may be brought to your room.  For patients admitted to the hospital, discharge time is determined by your treatment team.               Patients discharged the day of surgery will not be allowed to drive home.  Name and phone number of your driver: Family/Friend  Special Instructions: Shower using CHG 2 nights before surgery and the night before surgery.  If you shower the day of surgery use CHG.  Use special wash - you have one bottle of CHG for all showers.  You should use approximately 1/3 of the bottle for each shower.   Please read over the following fact sheets that you were given: Pain Booklet, Coughing and Deep Breathing and Surgical Site Infection Prevention

## 2013-05-23 ENCOUNTER — Encounter (HOSPITAL_COMMUNITY)
Admission: RE | Admit: 2013-05-23 | Discharge: 2013-05-23 | Disposition: A | Discharge status: Home / Self Care | Payer: Medicare Other | Source: Ambulatory Visit | Attending: Vascular Surgery | Admitting: Vascular Surgery

## 2013-05-23 ENCOUNTER — Encounter (HOSPITAL_COMMUNITY): Payer: Self-pay

## 2013-05-23 HISTORY — DX: Mental disorder, not otherwise specified: F99

## 2013-05-23 LAB — CBC
HEMATOCRIT: 36.3 % — AB (ref 39.0–52.0)
HEMOGLOBIN: 12.7 g/dL — AB (ref 13.0–17.0)
MCH: 32 pg (ref 26.0–34.0)
MCHC: 35 g/dL (ref 30.0–36.0)
MCV: 91.4 fL (ref 78.0–100.0)
Platelets: 267 10*3/uL (ref 150–400)
RBC: 3.97 MIL/uL — ABNORMAL LOW (ref 4.22–5.81)
RDW: 13 % (ref 11.5–15.5)
WBC: 15 10*3/uL — AB (ref 4.0–10.5)

## 2013-05-23 LAB — COMPREHENSIVE METABOLIC PANEL
ALBUMIN: 3.7 g/dL (ref 3.5–5.2)
ALK PHOS: 109 U/L (ref 39–117)
ALT: 24 U/L (ref 0–53)
AST: 16 U/L (ref 0–37)
BUN: 53 mg/dL — AB (ref 6–23)
CHLORIDE: 101 meq/L (ref 96–112)
CO2: 23 meq/L (ref 19–32)
Calcium: 9.5 mg/dL (ref 8.4–10.5)
Creatinine, Ser: 4.1 mg/dL — ABNORMAL HIGH (ref 0.50–1.35)
GFR calc non Af Amer: 16 mL/min — ABNORMAL LOW (ref >90)
GFR, EST AFRICAN AMERICAN: 19 mL/min — AB (ref >90)
GLUCOSE: 122 mg/dL — AB (ref 70–99)
POTASSIUM: 5 meq/L (ref 3.7–5.3)
Sodium: 141 mEq/L (ref 137–147)
Total Protein: 7.6 g/dL (ref 6.0–8.3)

## 2013-05-23 MED ORDER — DEXTROSE 5 % IV SOLN
1.5000 g | INTRAVENOUS | Status: AC
  Administered 2013-05-24: 1.5 g via INTRAVENOUS
  Filled 2013-05-23: qty 1.5

## 2013-05-23 NOTE — Progress Notes (Signed)
Spoke with Bethena Roys, RN at Dr. Lianne Moris office to make MD aware of pt abnormal WBC ( 15.0).

## 2013-05-23 NOTE — Progress Notes (Signed)
Anesthesia Chart Review:  Patient is a 43 year old male scheduled for creation of LUE AVF on 05/24/13 by Dr. Bridgett Larsson. PAT was on 05/23/13 at 3 PM.  History includes Schizoaffective disorder, HTN, CKD not yet on dialysis, liver disease (not specified--and not noted in renal notes scanned under Media tab), DM2. He does smoke but denied ETOH and illicit drug use. PCP is listed as Dr. London Pepper. Nephrologist is Dr. Pearson Grippe.  Labs from today noted.  AST/ALT WNL.  Cr 4.10.  K 5.0.  WBC 15.0, H/H 12.7/36.3. He will get an ISTAT on arrival.  Previous EKG and CXR were requested from his PCP and are still pending.  His PAT RN can have me review if received prior to 5:00 PM, otherwise the anesthesiologist will have to review preoperatively. If not received then he will need on arrival.  George Hugh [facility]/Anesthesiology Phone [phone] 05/23/2013 4:36 PM

## 2013-05-24 ENCOUNTER — Ambulatory Visit (HOSPITAL_COMMUNITY)
Admission: RE | Admit: 2013-05-24 | Discharge: 2013-05-24 | Disposition: A | Discharge status: Home / Self Care | Payer: Medicare Other | Source: Ambulatory Visit | Attending: Vascular Surgery | Admitting: Vascular Surgery

## 2013-05-24 ENCOUNTER — Ambulatory Visit (HOSPITAL_COMMUNITY): Payer: Medicare Other | Admitting: Certified Registered"

## 2013-05-24 ENCOUNTER — Telehealth: Payer: Self-pay | Admitting: Vascular Surgery

## 2013-05-24 ENCOUNTER — Encounter (HOSPITAL_COMMUNITY): Payer: Medicare Other | Admitting: Vascular Surgery

## 2013-05-24 ENCOUNTER — Encounter (HOSPITAL_COMMUNITY): Payer: Self-pay | Admitting: *Deleted

## 2013-05-24 ENCOUNTER — Ambulatory Visit (HOSPITAL_COMMUNITY): Payer: Medicare Other

## 2013-05-24 ENCOUNTER — Encounter (HOSPITAL_COMMUNITY)
Admission: RE | Disposition: A | Discharge status: Home / Self Care | Payer: Self-pay | Source: Ambulatory Visit | Attending: Vascular Surgery

## 2013-05-24 DIAGNOSIS — F17200 Nicotine dependence, unspecified, uncomplicated: Secondary | ICD-10-CM | POA: Insufficient documentation

## 2013-05-24 DIAGNOSIS — I129 Hypertensive chronic kidney disease with stage 1 through stage 4 chronic kidney disease, or unspecified chronic kidney disease: Secondary | ICD-10-CM | POA: Insufficient documentation

## 2013-05-24 DIAGNOSIS — N183 Chronic kidney disease, stage 3 unspecified: Secondary | ICD-10-CM

## 2013-05-24 DIAGNOSIS — Z01812 Encounter for preprocedural laboratory examination: Secondary | ICD-10-CM | POA: Insufficient documentation

## 2013-05-24 DIAGNOSIS — E119 Type 2 diabetes mellitus without complications: Secondary | ICD-10-CM | POA: Insufficient documentation

## 2013-05-24 DIAGNOSIS — Z794 Long term (current) use of insulin: Secondary | ICD-10-CM | POA: Insufficient documentation

## 2013-05-24 DIAGNOSIS — E785 Hyperlipidemia, unspecified: Secondary | ICD-10-CM | POA: Insufficient documentation

## 2013-05-24 DIAGNOSIS — N184 Chronic kidney disease, stage 4 (severe): Secondary | ICD-10-CM | POA: Insufficient documentation

## 2013-05-24 HISTORY — PX: AV FISTULA PLACEMENT: SHX1204

## 2013-05-24 LAB — POCT I-STAT 4, (NA,K, GLUC, HGB,HCT)
Glucose, Bld: 116 mg/dL — ABNORMAL HIGH (ref 70–99)
HEMATOCRIT: 40 % (ref 39.0–52.0)
HEMOGLOBIN: 13.6 g/dL (ref 13.0–17.0)
Potassium: 4.8 mEq/L (ref 3.7–5.3)
Sodium: 141 mEq/L (ref 137–147)

## 2013-05-24 LAB — GLUCOSE, CAPILLARY
Glucose-Capillary: 122 mg/dL — ABNORMAL HIGH (ref 70–99)
Glucose-Capillary: 99 mg/dL (ref 70–99)

## 2013-05-24 SURGERY — ARTERIOVENOUS (AV) FISTULA CREATION
Anesthesia: Monitor Anesthesia Care | Site: Arm Upper | Laterality: Left

## 2013-05-24 MED ORDER — SODIUM CHLORIDE 0.9 % IR SOLN
Status: DC | PRN
  Administered 2013-05-24: 11:00:00

## 2013-05-24 MED ORDER — ONDANSETRON HCL 4 MG/2ML IJ SOLN: 4.0000 mg | Freq: Once | INTRAMUSCULAR | Status: DC | PRN

## 2013-05-24 MED ORDER — LIDOCAINE-EPINEPHRINE (PF) 1 %-1:200000 IJ SOLN
INTRAMUSCULAR | Status: DC | PRN
  Administered 2013-05-24: 10 mL via INTRADERMAL

## 2013-05-24 MED ORDER — THROMBIN 20000 UNITS EX SOLR
CUTANEOUS | Status: AC
  Filled 2013-05-24: qty 20000

## 2013-05-24 MED ORDER — 0.9 % SODIUM CHLORIDE (POUR BTL) OPTIME
TOPICAL | Status: DC | PRN
  Administered 2013-05-24: 1000 mL

## 2013-05-24 MED ORDER — BUPIVACAINE HCL (PF) 0.5 % IJ SOLN
INTRAMUSCULAR | Status: DC | PRN
Start: 2013-05-24 — End: 2013-05-24
  Administered 2013-05-24: 5 mL

## 2013-05-24 MED ORDER — HYDROMORPHONE HCL PF 1 MG/ML IJ SOLN
INTRAMUSCULAR | Status: AC
  Filled 2013-05-24: qty 1

## 2013-05-24 MED ORDER — OXYCODONE HCL 5 MG PO TABS
ORAL_TABLET | ORAL | Status: AC
  Filled 2013-05-24: qty 1

## 2013-05-24 MED ORDER — OXYCODONE-ACETAMINOPHEN 5-325 MG PO TABS: 1.0000 | ORAL_TABLET | Freq: Four times a day (QID) | ORAL | Status: DC | PRN

## 2013-05-24 MED ORDER — MEPERIDINE HCL 25 MG/ML IJ SOLN: 6.2500 mg | INTRAMUSCULAR | Status: DC | PRN

## 2013-05-24 MED ORDER — SODIUM CHLORIDE 0.9 % IV SOLN
INTRAVENOUS | Status: DC
  Administered 2013-05-24: 09:00:00 via INTRAVENOUS

## 2013-05-24 MED ORDER — OXYCODONE HCL 5 MG/5ML PO SOLN: 5.0000 mg | Freq: Once | ORAL | Status: DC | PRN

## 2013-05-24 MED ORDER — HYDROMORPHONE HCL PF 1 MG/ML IJ SOLN
0.2500 mg | INTRAMUSCULAR | Status: DC | PRN
  Administered 2013-05-24 (×2): 0.5 mg via INTRAVENOUS

## 2013-05-24 MED ORDER — FENTANYL CITRATE 0.05 MG/ML IJ SOLN
INTRAMUSCULAR | Status: DC | PRN
  Administered 2013-05-24: 100 ug via INTRAVENOUS
  Administered 2013-05-24 (×2): 25 ug via INTRAVENOUS

## 2013-05-24 MED ORDER — OXYCODONE HCL 5 MG PO TABS: 5.0000 mg | ORAL_TABLET | Freq: Once | ORAL | Status: DC | PRN

## 2013-05-24 MED ORDER — PROPOFOL INFUSION 10 MG/ML OPTIME
INTRAVENOUS | Status: DC | PRN
  Administered 2013-05-24: 120 ug/kg/min via INTRAVENOUS

## 2013-05-24 MED ORDER — MIDAZOLAM HCL 5 MG/5ML IJ SOLN
INTRAMUSCULAR | Status: DC | PRN
  Administered 2013-05-24: 2 mg via INTRAVENOUS

## 2013-05-24 MED ORDER — LIDOCAINE-EPINEPHRINE (PF) 1 %-1:200000 IJ SOLN
INTRAMUSCULAR | Status: AC
  Filled 2013-05-24: qty 10

## 2013-05-24 SURGICAL SUPPLY — 35 items
ARMBAND PINK RESTRICT EXTREMIT (MISCELLANEOUS) ×3 IMPLANT
CANISTER SUCTION 2500CC (MISCELLANEOUS) ×3 IMPLANT
CLIP TI MEDIUM 6 (CLIP) ×3 IMPLANT
CLIP TI WIDE RED SMALL 6 (CLIP) ×3 IMPLANT
COVER PROBE W GEL 5X96 (DRAPES) ×3 IMPLANT
COVER SURGICAL LIGHT HANDLE (MISCELLANEOUS) ×3 IMPLANT
DECANTER SPIKE VIAL GLASS SM (MISCELLANEOUS) IMPLANT
DERMABOND ADVANCED (GAUZE/BANDAGES/DRESSINGS) ×2
DERMABOND ADVANCED .7 DNX12 (GAUZE/BANDAGES/DRESSINGS) ×1 IMPLANT
ELECT REM PT RETURN 9FT ADLT (ELECTROSURGICAL) ×3
ELECTRODE REM PT RTRN 9FT ADLT (ELECTROSURGICAL) ×1 IMPLANT
GLOVE BIO SURGEON STRL SZ7 (GLOVE) ×3 IMPLANT
GLOVE BIOGEL PI IND STRL 6.5 (GLOVE) ×2 IMPLANT
GLOVE BIOGEL PI IND STRL 7.5 (GLOVE) ×2 IMPLANT
GLOVE BIOGEL PI INDICATOR 6.5 (GLOVE) ×4
GLOVE BIOGEL PI INDICATOR 7.5 (GLOVE) ×4
GLOVE SS BIOGEL STRL SZ 7 (GLOVE) ×1 IMPLANT
GLOVE SUPERSENSE BIOGEL SZ 7 (GLOVE) ×2
GOWN STRL NON-REIN LRG LVL3 (GOWN DISPOSABLE) IMPLANT
KIT BASIN OR (CUSTOM PROCEDURE TRAY) ×3 IMPLANT
KIT ROOM TURNOVER OR (KITS) ×3 IMPLANT
NEEDLE HYPO 25GX1X1/2 BEV (NEEDLE) IMPLANT
NS IRRIG 1000ML POUR BTL (IV SOLUTION) ×3 IMPLANT
PACK CV ACCESS (CUSTOM PROCEDURE TRAY) ×3 IMPLANT
PAD ARMBOARD 7.5X6 YLW CONV (MISCELLANEOUS) ×6 IMPLANT
SPONGE SURGIFOAM ABS GEL 100 (HEMOSTASIS) IMPLANT
SUT MNCRL AB 4-0 PS2 18 (SUTURE) ×3 IMPLANT
SUT PROLENE 6 0 BV (SUTURE) ×3 IMPLANT
SUT PROLENE 7 0 BV 1 (SUTURE) ×3 IMPLANT
SUT VIC AB 3-0 SH 27 (SUTURE) ×2
SUT VIC AB 3-0 SH 27X BRD (SUTURE) ×1 IMPLANT
TOWEL OR 17X24 6PK STRL BLUE (TOWEL DISPOSABLE) ×3 IMPLANT
TOWEL OR 17X26 10 PK STRL BLUE (TOWEL DISPOSABLE) ×3 IMPLANT
UNDERPAD 30X30 INCONTINENT (UNDERPADS AND DIAPERS) ×3 IMPLANT
WATER STERILE IRR 1000ML POUR (IV SOLUTION) ×3 IMPLANT

## 2013-05-24 NOTE — Telephone Encounter (Addendum)
Message copied by Gena Fray on Wed May 24, 2013  2:01 PM ------      Message from: Alfonso Patten      Created: Wed May 24, 2013 12:11 PM                   ----- Message -----         From: Conrad [place], MD         Sent: 05/24/2013  11:58 AM           To: Patrici Ranks, Alfonso Patten, RN            TREVELL STJEAN      JD:[id number]      06-14-1970            PROCEDURE:      left brachiocephalic arteriovenous fistula placement            Asst: Gerri Lins, [facility]             Follow-up: 6 weeks ------  05/24/13: spoke with pts mother to schedule, dpm

## 2013-05-24 NOTE — Op Note (Signed)
OPERATIVE NOTE   PROCEDURE: left brachiocephalic arteriovenous fistula placement  PRE-OPERATIVE DIAGNOSIS: chronic kidney disease stage III-IV   POST-OPERATIVE DIAGNOSIS: same as above   SURGEON: Adele Barthel, MD  ASSISTANT(S): Gerri Lins, PAC   ANESTHESIA: local and MAC  ESTIMATED BLOOD LOSS: 50 cc  FINDING(S): 1.  Palpable thrill in fistula at end of case 2.  Dopplerable left radial signal  SPECIMEN(S):  none  INDICATIONS:   Kevin Deleon is a 43 y.o. male who presents with chronic kidney disease stage III-IV.  The patient is scheduled for left brachiocephalic arteriovenous fistula placement.  The patient is aware the risks include but are not limited to: bleeding, infection, steal syndrome, nerve damage, ischemic monomelic neuropathy, failure to mature, and need for additional procedures.  The patient is aware of the risks of the procedure and elects to proceed forward.  DESCRIPTION: After full informed written consent was obtained from the patient, the patient was brought back to the operating room and placed supine upon the operating table.  Prior to induction, the patient received IV antibiotics.   After obtaining adequate anesthesia, the patient was then prepped and draped in the standard fashion for a left arm access procedure.  I turned my attention first to identifying the patient's cephalic vein and brachial artery.  Using SonoSite guidance, the location of these vessels were marked out on the skin.   At this point, I injected local anesthetic to obtain a field block of the antecubitum.  In total, I injected about 5 mL of a 1:1 mixture of 0.5% Marcaine without epinephrine and 1% lidocaine with epinephrine.  I made a transverse incision at the level of the antecubitum and dissected through the subcutaneous tissue and fascia to gain exposure of the brachial artery.  This was noted to be 3 mm in diameter externally.  This was dissected out proximally and distally and  controlled with vessel loops .  I then dissected out the cephalic vein.  This was noted to be 3 mm in diameter externally.  The distal segment of the vein was ligated with a  2-0 silk, and the vein was transected.  The proximal segment was iinterrogated with serial dilators.  The vein accepted up to a 3 mm dilator without any difficulty.  I then instilled the heparinized saline into the vein and clamped it.  At this point, I reset my exposure of the brachial artery and placed the artery under tension proximally and distally.  I made an arteriotomy with a #11 blade, and then I extended the arteriotomy with a Potts scissor.  I injected heparinized saline proximal and distal to this arteriotomy.  The vein was then sewn to the artery in an end-to-side configuration with a running stitch of 7-0 Prolene.  Prior to completing this anastomosis, I allowed the vein and artery to backbleed.  There was no evidence of clot from any vessels.  I completed the anastomosis in the usual fashion and then released all vessel loops and clamps.  There was a palpable thrill in the venous outflow, and there was a dopplerable radial signal.  At this point, I irrigated out the surgical wound.  There was no further active bleeding.  The subcutaneous tissue was reapproximated with a running stitch of 3-0 Vicryl.  The skin was then reapproximated with a running subcuticular stitch of 4-0 Vicryl.  The skin was then cleaned, dried, and reinforced with Dermabond.  The patient tolerated this procedure well.   COMPLICATIONS: none  CONDITION: stable  Adele Barthel, MD [facility] [place] Office: [phone] Pager: [phone]  05/24/2013, 11:55 AM

## 2013-05-24 NOTE — Anesthesia Preprocedure Evaluation (Addendum)
Anesthesia Evaluation  Patient identified by MRN, date of birth, ID band Patient awake    Reviewed: Allergy & Precautions, H&P , NPO status , Patient's Chart, lab work & pertinent test results, reviewed documented beta blocker date and time   Airway Mallampati: I TM Distance: >3 FB Neck ROM: Full    Dental  (+) Teeth Intact   Pulmonary Current Smoker,          Cardiovascular hypertension, Pt. on medications     Neuro/Psych    GI/Hepatic   Endo/Other  diabetes, Poorly Controlled, Type 2, Insulin Dependent  Renal/GU Renal Insufficiency and DialysisRenal disease     Musculoskeletal   Abdominal   Peds  Hematology   Anesthesia Other Findings   Reproductive/Obstetrics                          Anesthesia Physical Anesthesia Plan  ASA: III  Anesthesia Plan: MAC   Post-op Pain Management:    Induction: Intravenous  Airway Management Planned:   Additional Equipment:   Intra-op Plan:   Post-operative Plan:   Informed Consent: I have reviewed the patients History and Physical, chart, labs and discussed the procedure including the risks, benefits and alternatives for the proposed anesthesia with the patient or authorized representative who has indicated his/her understanding and acceptance.     Plan Discussed with: CRNA, Surgeon and Anesthesiologist  Anesthesia Plan Comments:        Anesthesia Quick Evaluation

## 2013-05-24 NOTE — Anesthesia Procedure Notes (Signed)
Procedure Name: MAC Date/Time: 05/24/2013 10:40 AM Performed by: Julian Reil Pre-anesthesia Checklist: Patient identified, Emergency Drugs available, Suction available and Patient being monitored Patient Re-evaluated:Patient Re-evaluated prior to inductionOxygen Delivery Method: Circle system utilized Preoxygenation: Pre-oxygenation with 100% oxygen Intubation Type: IV induction Placement Confirmation: positive ETCO2 and breath sounds checked- equal and bilateral Tube secured with: Tape

## 2013-05-24 NOTE — Anesthesia Postprocedure Evaluation (Signed)
Anesthesia Post Note  Patient: Kevin Deleon  Procedure(s) Performed: Procedure(s) (LRB): ARTERIOVENOUS (AV) FISTULA CREATION BRACHIAL-CEPHALIC (Left)  Anesthesia type: general  Patient location: PACU  Post pain: Pain level controlled  Post assessment: Patient's Cardiovascular Status Stable  Last Vitals:  Filed Vitals:   05/24/13 1254  BP: 148/90  Pulse:   Temp:   Resp:     Post vital signs: Reviewed and stable  Level of consciousness: sedated  Complications: No apparent anesthesia complications

## 2013-05-24 NOTE — H&P (View-Only) (Signed)
[facility] [place]  Referred by:  Estanislado Emms, MD [address]                          [place], [place] [address]  Reason for referral: New access  History of Present Illness  Kevin Deleon is a 43 y.o. ([birth year]) male who presents for evaluation for permanent access.  The patient is right hand dominant.  The patient has not had previous access procedures.  Previous central venous cannulation procedures include: none.  The patient has never had a PPM placed.  Patient is being told he will need HD within next 6 months.  Past Medical History  Diagnosis Date  . HTN (hypertension)   . Diabetes mellitus   . Hyperlipidemia   . Liver disease   . Chronic kidney disease     History reviewed. No pertinent past surgical history.  History   Social History  . Marital Status: Single    Spouse Name: N/A    Number of Children: N/A  . Years of Education: N/A   Occupational History  . Not on file.   Social History Main Topics  . Smoking status: Current Every Day Smoker    Types: Cigarettes  . Smokeless tobacco: Never Used  . Alcohol Use: No  . Drug Use: No  . Sexual Activity: Not on file   Other Topics Concern  . Not on file   Social History Narrative  . No narrative on file   Fam History: both parents alive without significant medical issues  Current Outpatient Prescriptions on File Prior to Visit  Medication Sig Dispense Refill  . amLODipine (NORVASC) 10 MG tablet Take 10 mg by mouth daily.      Marland Kitchen glipiZIDE (GLUCOTROL) 10 MG tablet Take 10 mg by mouth 2 (two) times daily before a meal.      . LANTUS SOLOSTAR 100 UNIT/ML injection       . risperiDONE (RISPERDAL) 2 MG tablet Take 1 tablet (2 mg total) by mouth at bedtime.  90 tablet  0  . simvastatin (ZOCOR) 20 MG tablet Take 20 mg by mouth daily.       No current facility-administered medications on file prior to visit.    No Known Allergies   REVIEW OF SYSTEMS:  (Positives checked otherwise  negative)  CARDIOVASCULAR:  []  chest pain, []  chest pressure, []  palpitations, []  shortness of breath when laying flat, []  shortness of breath with exertion,  []  pain in feet when walking, []  pain in feet when laying flat, []  history of blood clot in veins (DVT), []  history of phlebitis, []  swelling in legs, []  varicose veins  PULMONARY:  []  productive cough, []  asthma, []  wheezing  NEUROLOGIC:  []  weakness in arms or legs, []  numbness in arms or legs, []  difficulty speaking or slurred speech, []  temporary loss of vision in one eye, []  dizziness  HEMATOLOGIC:  []  bleeding problems, []  problems with blood clotting too easily  MUSCULOSKEL:  []  joint pain, []  joint swelling  GASTROINTEST:  []  vomiting blood, []  blood in stool     GENITOURINARY:  []  burning with urination, []  blood in urine  PSYCHIATRIC:  []  history of major depression  INTEGUMENTARY:  []  rashes, []  ulcers  CONSTITUTIONAL:  []  fever, []  chills  Physical Examination  Filed Vitals:   05/05/13 1653  BP: 137/93  Pulse: 96  Resp: 18  Height: 5\' 11"  (1.803 m)  Weight:  229 lb (103.874 kg)   Body mass index is 31.95 kg/(m^2).  General: A&O x 3, WDWN  Head: [place]/AT  Ear/Nose/Throat: Hearing grossly intact, nares w/o erythema or drainage, oropharynx w/o Erythema/Exudate, Mallampati score: 3  Eyes: PERRLA, EOMI  Neck: Supple, no nuchal rigidity, no palpable LAD  Pulmonary: Sym exp, good air movt, CTAB, no rales, rhonchi, & wheezing  Cardiac: RRR, Nl S1, S2, no Murmurs, rubs or gallops  Vascular: Vessel Right Left  Radial Palpable Palpable  Ulnar Palpable Palpable  Brachial Palpable Palpable  Carotid Palpable, without bruit Palpable, without bruit  Aorta Not palpable N/A  Femoral Palpable Palpable  Popliteal Not palpable Not palpable  PT Palpable Palpable  DP Palpable Palpable   Gastrointestinal: soft, NTND, -G/R, - HSM, - masses, - CVAT B  Musculoskeletal: M/S 5/5 throughout , Extremities without ischemic  changes   Neurologic: CN 2-12 intact , Pain and light touch intact in extremities , Motor exam as listed above  Psychiatric: Judgment intact, Mood & affect appropriate for pt's clinical situation  Dermatologic: See M/S exam for extremity exam, no rashes otherwise noted  Lymph : No Cervical, Axillary, or Inguinal lymphadenopathy   Non-Invasive Vascular Imaging  Vein Mapping  (Date: 05/05/2013):   R arm: acceptable vein conduits include R upper arm cephalic  L arm: acceptable vein conduits include R upper arm cephalic and basilic  BUE arterial duplex (05/05/2013)  Triphasic throughout both arms  Outside Studies/Documentation 4 pages of outside documents were reviewed including: outpatient nephrologic chart.  Medical Decision Making  Kevin Deleon is a 43 y.o. male who presents with chronic kidney disease stage IV  Based on vein mapping and examination, this patient's permanent access options include: R BC AVF, L BC AVF, L BVT.  I would start with L BC AVF.  I had an extensive discussion with this patient in regards to the nature of access surgery, including risk, benefits, and alternatives.    The patient is aware that the risks of access surgery include but are not limited to: bleeding, infection, steal syndrome, nerve damage, ischemic monomelic neuropathy, failure of access to mature, and possible need for additional access procedures in the future.  The patient has agreed to proceed with the above procedure which will be scheduled 7 JAN 15.  Adele Barthel, MD [facility] [place] Office: [phone] Pager: [phone]  05/05/2013, 5:19 PM

## 2013-05-24 NOTE — Preoperative (Signed)
Beta Blockers   Reason not to administer Beta Blockers:Not Applicable 

## 2013-05-24 NOTE — Transfer of Care (Signed)
Immediate Anesthesia Transfer of Care Note  Patient: Kevin Deleon  Procedure(s) Performed: Procedure(s): ARTERIOVENOUS (AV) FISTULA CREATION BRACHIAL-CEPHALIC (Left)  Patient Location: PACU  Anesthesia Type:MAC  Level of Consciousness: awake, alert , oriented and patient cooperative  Airway & Oxygen Therapy: Patient Spontanous Breathing and Patient connected to nasal cannula oxygen  Post-op Assessment: Report given to PACU RN, Post -op Vital signs reviewed and stable and Patient moving all extremities  Post vital signs: Reviewed and stable  Complications: No apparent anesthesia complications

## 2013-05-24 NOTE — Interval H&P Note (Signed)
History and Physical Interval Note:  05/24/2013 8:29 AM  Kevin Deleon  has presented today for surgery, with the diagnosis of ESRD  The various methods of treatment have been discussed with the patient and family. After consideration of risks, benefits and other options for treatment, the patient has consented to  Procedure(s): ARTERIOVENOUS (AV) FISTULA CREATION BRACHIAL-CEPHALIC (Left) as a surgical intervention .  The patient's history has been reviewed, patient examined, no change in status, stable for surgery.  I have reviewed the patient's chart and labs.  Questions were answered to the patient's satisfaction.     CHEN,BRIAN LIANG-YU

## 2013-05-28 ENCOUNTER — Other Ambulatory Visit (HOSPITAL_COMMUNITY): Payer: Self-pay | Admitting: Psychiatry

## 2013-05-28 DIAGNOSIS — F259 Schizoaffective disorder, unspecified: Secondary | ICD-10-CM

## 2013-05-29 ENCOUNTER — Encounter (HOSPITAL_COMMUNITY): Payer: Self-pay | Admitting: Vascular Surgery

## 2013-06-06 ENCOUNTER — Other Ambulatory Visit: Payer: Self-pay | Admitting: Vascular Surgery

## 2013-06-06 DIAGNOSIS — Z4931 Encounter for adequacy testing for hemodialysis: Secondary | ICD-10-CM

## 2013-06-06 DIAGNOSIS — N186 End stage renal disease: Secondary | ICD-10-CM

## 2013-07-03 ENCOUNTER — Ambulatory Visit (HOSPITAL_COMMUNITY): Payer: Self-pay | Admitting: Psychiatry

## 2013-07-06 ENCOUNTER — Encounter: Payer: Self-pay | Admitting: Vascular Surgery

## 2013-07-07 ENCOUNTER — Encounter: Payer: Self-pay | Admitting: Vascular Surgery

## 2013-07-07 ENCOUNTER — Encounter (HOSPITAL_COMMUNITY): Payer: Self-pay | Admitting: Psychiatry

## 2013-07-07 ENCOUNTER — Ambulatory Visit (HOSPITAL_COMMUNITY)
Admission: RE | Admit: 2013-07-07 | Discharge: 2013-07-07 | Disposition: A | Discharge status: Home / Self Care | Payer: Medicare Other | Source: Ambulatory Visit | Attending: Vascular Surgery | Admitting: Vascular Surgery

## 2013-07-07 ENCOUNTER — Other Ambulatory Visit (HOSPITAL_COMMUNITY): Payer: Self-pay

## 2013-07-07 ENCOUNTER — Ambulatory Visit (INDEPENDENT_AMBULATORY_CARE_PROVIDER_SITE_OTHER): Payer: 59 | Admitting: Psychiatry

## 2013-07-07 ENCOUNTER — Ambulatory Visit (INDEPENDENT_AMBULATORY_CARE_PROVIDER_SITE_OTHER): Payer: Self-pay | Admitting: Vascular Surgery

## 2013-07-07 VITALS — BP 140/87 | HR 91 | Ht 71.0 in | Wt 233.0 lb

## 2013-07-07 VITALS — BP 133/80 | HR 77 | Wt 235.6 lb

## 2013-07-07 DIAGNOSIS — Z09 Encounter for follow-up examination after completed treatment for conditions other than malignant neoplasm: Secondary | ICD-10-CM | POA: Insufficient documentation

## 2013-07-07 DIAGNOSIS — N186 End stage renal disease: Secondary | ICD-10-CM | POA: Insufficient documentation

## 2013-07-07 DIAGNOSIS — N184 Chronic kidney disease, stage 4 (severe): Secondary | ICD-10-CM

## 2013-07-07 DIAGNOSIS — Z9889 Other specified postprocedural states: Secondary | ICD-10-CM | POA: Insufficient documentation

## 2013-07-07 DIAGNOSIS — F259 Schizoaffective disorder, unspecified: Secondary | ICD-10-CM

## 2013-07-07 DIAGNOSIS — Z4931 Encounter for adequacy testing for hemodialysis: Secondary | ICD-10-CM

## 2013-07-07 MED ORDER — RISPERIDONE 2 MG PO TABS: ORAL_TABLET | ORAL | Status: DC

## 2013-07-07 NOTE — Progress Notes (Signed)
    Established Dialysis Access  History of Present Illness  Kevin Deleon is a 43 y.o. (1971-03-23) male who presents for re-evaluation for permanent access.     Physical Examination  Palpable thrill left Brachial Cephalic fistula. Incision is well healed No symptoms of steal     Filed Vitals:   07/07/13 1504  BP: 140/87  Pulse: 91  Height: 5\' 11"  (1.803 m)  Weight: 233 lb (105.688 kg)  SpO2: 98%   Body mass index is 32.51 kg/(m^2).    Fistula duplex Shows diameter 0.73 average with depth of 0.69.  Impression:  Kevin Deleon is a 43 y.o. male who presents with Brachial Cephalic fistula maturing well.   ESRD not requiring requiring hemodialysis at this time.  Plan: F/U PRN Check for palpable thrill daily if there is no thrill call us immediately.  Theda Sers EMMA [facility] PA-C [facility] [place] Office: [phone]   07/07/2013, 3:29 PM  Addendum  I have independently interviewed and examined the patient, and I agree with the physician assistant's findings.  Mature fistula with some depth.  I suspect with additional maturation, it should be easily palpable.  Follow up as needed.  Adele Barthel, MD [facility] [place] Office: [phone] Pager: [phone]  07/07/2013, 4:25 PM

## 2013-07-07 NOTE — Progress Notes (Signed)
Kevin Deleon [phone] [facility] Note   Kevin Deleon [id number] 43 y.o.  07/07/2013 11:44 AM  Chief Complaint:  Medication management and followup.    History of Present Illness: Kevin Deleon came for his followup appointment with his mother.  Mother does no recent agitation anger or any mood swing.  Most of the information was obtained from the mother because patient does not talk in details.  Recently he has fistula because of her anticipation of dialysis in the future.  His last creatinine was more than 4.  His blood pressure was increased and recently Demadex was added to his medication regime.  Today his vitals were normal.  Patient remains very withdrawn and limited to provide information but as per mother is been sleeping good .  Patient denies any hallucination or any paranoid thinking.  He is getting along with his neighbors.  He does not interact outside unless he needed.  Patient is not drinking or using any illegal substances however he continues to smoke .  Patient is living in a facility which will be a smoke-free in the future.  Patient and mother is concerned about this to smoking and I have recommended that he should stop smoking now .  I also discussed about side effects of smoking this patient is already aware .  Patient denies any suicidal thoughts or homicidal thoughts.  He denies any tremors or shakes.    Suicidal Ideation: No Plan Formed: No Patient has means to carry out plan: No  Homicidal Ideation: No Plan Formed: No Patient has means to carry out plan: No  Review of Systems: Psychiatric: Agitation: No Hallucination: No Depressed Mood: No Insomnia: No Hypersomnia: No Altered Concentration: No Feels Worthless: No Grandiose Ideas: No Belief In Special Powers: No New/Increased Substance Abuse: No Compulsions: No  Neurologic: Headache: No Seizure: No Paresthesias: No  Medical History:  Patient has history of obesity, diabetes mellitus, hypertension,  stage IV renal insufficiency and hyperlipidemia.    Psychosocial history. Patient was born in [place] and raised in [place].  He is living in [place] since age 28.  Patient never married.  He has no children.  Patient lives by himself in his apartment.  His mother lives in [place] with her partner.  Patient has a sister however patient does not get along with her.  Patient has no knowledge about his biological father patient told that his father disowned him.  Outpatient Encounter Prescriptions as of 07/07/2013  Medication Sig  . amLODipine (NORVASC) 10 MG tablet Take 10 mg by mouth daily.  . cholecalciferol (VITAMIN D) 1000 UNITS tablet Take 1,000 Units by mouth daily.  Marland Kitchen glipiZIDE (GLUCOTROL) 5 MG tablet Take 5 mg by mouth daily before breakfast.  . LANTUS SOLOSTAR 100 UNIT/ML injection Inject 28 Units into the skin at bedtime.   . Multiple Vitamin (MULTIVITAMIN WITH MINERALS) TABS tablet Take 1 tablet by mouth daily.  . risperiDONE (RISPERDAL) 2 MG tablet Take 1 tablet (2 mg total)  by mouth at bedtime.  . simvastatin (ZOCOR) 20 MG tablet Take 20 mg by mouth daily.  Marland Kitchen torsemide (DEMADEX) 20 MG tablet Take 20 mg by mouth daily.  . [DISCONTINUED] risperiDONE (RISPERDAL) 2 MG tablet Take 1 tablet (2 mg total)  by mouth at bedtime.  . [DISCONTINUED] oxyCODONE-acetaminophen (PERCOCET/ROXICET) 5-325 MG per tablet Take 1 tablet by mouth every 6 (six) hours as needed.    Past Psychiatric History/Hospitalization(s): Patient started to have psychiatric illness at age 1. At that  time he was hospitalized due to paranoia and disorganized thinking. Though he denies any is suicidal attempt but admitted significant mood swings anger and hallucination in the past. He has at least 5 psychiatric hospitalization mostly due to noncompliance of medication and disorganized thinking. In the past he has taken lithium which was discontinued due to kidney function issues. He was seeing  [facility] for many years until recently changed to [facility]. In the past he had tried Haldol which he actually likes but when the dose was increased to 15 mg he became zombie and stopped. He likes Risperdal. In the past he also tried amitriptyline by Dr. Milagros Loll. Patient has history of mania and psychosis.  Anxiety: No Bipolar Disorder: Yes Depression: Yes Mania: Yes Psychosis: Yes Schizophrenia: Yes Personality Disorder: No Hospitalization for psychiatric illness: He has at least 5 psychiatric hospitalization.  His last hospitalization was in October 2012 at old [facility] History of Electroconvulsive Shock Therapy: No Prior Suicide Attempts: No  Recent Results (from the past 2160 hour(s))  CBC     Status: Abnormal   Collection Time    05/23/13  3:12 PM      Result Value Ref Range   WBC 15.0 (*) 4.0 - 10.5 K/uL   RBC 3.97 (*) 4.22 - 5.81 MIL/uL   Hemoglobin 12.7 (*) 13.0 - 17.0 g/dL   HCT 36.3 (*) 39.0 - 52.0 %   MCV 91.4  78.0 - 100.0 fL   MCH 32.0  26.0 - 34.0 pg   MCHC 35.0  30.0 - 36.0 g/dL   RDW 13.0  11.5 - 15.5 %   Platelets 267  150 - 400 K/uL  COMPREHENSIVE METABOLIC PANEL     Status: Abnormal   Collection Time    05/23/13  3:12 PM      Result Value Ref Range   Sodium 141  137 - 147 mEq/L   Potassium 5.0  3.7 - 5.3 mEq/L   Chloride 101  96 - 112 mEq/L   CO2 23  19 - 32 mEq/L   Glucose, Bld 122 (*) 70 - 99 mg/dL   BUN 53 (*) 6 - 23 mg/dL   Creatinine, Ser 4.10 (*) 0.50 - 1.35 mg/dL   Calcium 9.5  8.4 - 10.5 mg/dL   Total Protein 7.6  6.0 - 8.3 g/dL   Albumin 3.7  3.5 - 5.2 g/dL   AST 16  0 - 37 U/L   Comment: HEMOLYSIS AT THIS LEVEL MAY AFFECT RESULT   ALT 24  0 - 53 U/L   Alkaline Phosphatase 109  39 - 117 U/L   Total Bilirubin <0.2 (*) 0.3 - 1.2 mg/dL   GFR calc non Af Amer 16 (*) >90 mL/min   GFR calc Af Amer 19 (*) >90 mL/min   Comment: (NOTE)     The eGFR has been calculated using the CKD EPI equation.     This calculation  has not been validated in all clinical situations.     eGFR's persistently <90 mL/min signify possible Chronic Kidney     Disease.  GLUCOSE, CAPILLARY     Status: Abnormal   Collection Time    05/24/13  7:49 AM      Result Value Ref Range   Glucose-Capillary 122 (*) 70 - 99 mg/dL  POCT I-STAT 4, (NA,K, GLUC, HGB,HCT)     Status: Abnormal   Collection Time    05/24/13  9:30 AM      Result Value Ref  Range   Sodium 141  137 - 147 mEq/L   Potassium 4.8  3.7 - 5.3 mEq/L   Glucose, Bld 116 (*) 70 - 99 mg/dL   HCT 40.0  39.0 - 52.0 %   Hemoglobin 13.6  13.0 - 17.0 g/dL  GLUCOSE, CAPILLARY     Status: None   Collection Time    05/24/13 12:08 PM      Result Value Ref Range   Glucose-Capillary 99  70 - 99 mg/dL    Physical Exam: Constitutional:  BP 133/80  Pulse 77  Wt 235 lb 9.6 oz (106.867 kg)  General Appearance: well nourished, obese and Fairly groomed.  He maintained fair eye contact.  He is superficially cooperative and guarded.  Musculoskeletal: Strength & Muscle Tone: within normal limits Gait & Station: normal Patient leans: N/A  Mental Status Examination: Patient is casually dressed.  He has a long beard and is a dirt on his clothes.  His speech is slow and he has some thought blocking but he denies any auditory or visual hallucination.  His attention and concentration is fair.  He denies any paranoia or any delusions but remains very guarded.  He has no tremors or shakes.  His fund of knowledge is below average.  His psychomotor activity is slightly decreased.  He has no flight of ideas or any loose association.  His memory is intact.  He is alert and oriented x3.  He denies any suicidal thoughts or homicidal thoughts.  His insight judgment and impulse control is okay.  Established Problem, Stable/Improving (1), Review of Psycho-Social Stressors (1), Review or order clinical lab tests (1), Review and summation of old records (2), Review of Last Therapy Session (1) and Review  of Medication Regimen & Side Effects (2)  Assessment: Axis I: Schizoaffective disorder bipolar type  Axis II: Deferred  Axis III:  Patient Active Problem List   Diagnosis Date Noted  . Chronic kidney disease, stage IV (severe) 05/05/2013  . Chronic kidney disease (CKD), stage IV (severe) 10/11/2012  . Obesity, unspecified 08/09/2012  . Diabetes mellitus 08/09/2012  . HTN (hypertension) 08/09/2012  . Hyperlipidemia 08/09/2012    Axis IV: Mild to moderate  Axis V: 50-55   Plan:  Patient is stable on his current medication. I will contiune his risperidone 2 mg at present dose.  I reviewed his blood work results.  His creatinine is high and he is taking two anti-HTN meds.  Recommended to stop smoking.  Discussed in detail the risks and benefits of medication.  Patient is not on any side effects at this time.  I will see him again in 3 months.  I recommended to call us back if he has any question or any concern.Time spent 25 minutes.  More than 50% of the time spent in psychoeducation, counseling and coordination of care.  Discuss safety plan that anytime having active suicidal thoughts or homicidal thoughts then patient need to call 911 or go to the local emergency room.   Tavari Loadholt T., MD 07/07/2013

## 2013-08-08 ENCOUNTER — Other Ambulatory Visit (HOSPITAL_COMMUNITY): Payer: Self-pay | Admitting: Psychiatry

## 2013-10-04 ENCOUNTER — Encounter (HOSPITAL_COMMUNITY): Payer: Self-pay | Admitting: Psychiatry

## 2013-10-04 ENCOUNTER — Ambulatory Visit (INDEPENDENT_AMBULATORY_CARE_PROVIDER_SITE_OTHER): Payer: 59 | Admitting: Psychiatry

## 2013-10-04 VITALS — BP 130/80 | HR 100 | Ht 71.0 in | Wt 236.0 lb

## 2013-10-04 DIAGNOSIS — F259 Schizoaffective disorder, unspecified: Secondary | ICD-10-CM

## 2013-10-04 MED ORDER — RISPERIDONE 2 MG PO TABS: ORAL_TABLET | ORAL | Status: DC

## 2013-10-04 NOTE — Progress Notes (Signed)
[facility] [place] [facility] Note   PIUS BULSON [id number] 43 y.o.  10/04/2013 5:24 PM  Chief Complaint:  Medication management and followup.    History of Present Illness: Kevin Deleon came for his followup appointment with his mother.  Patient is complying with his medication and denies any side effects.  Mother does much improvement in his anger, paranoia, irritability and sleep.  Patient has no issues with his neighbor.  He is sleeping very good.  He has no tremors or shakes.  He denies any recent hallucination .  Patient is scheduled to see his nephrologist today .  Patient is not drinking alcohol or using any legal substances.  He usually stays by himself and leave this place unless it is important.  Patient has quit cigarette smoking and using electronic cigarette .  Patient told this facility will be smoke-free in 2 months .  Patient was to continue his current Risperdal.  Suicidal Ideation: No Plan Formed: No Patient has means to carry out plan: No  Homicidal Ideation: No Plan Formed: No Patient has means to carry out plan: No  Review of Systems: Psychiatric: Agitation: No Hallucination: No Depressed Mood: No Insomnia: No Hypersomnia: No Altered Concentration: No Feels Worthless: No Grandiose Ideas: No Belief In Special Powers: No New/Increased Substance Abuse: No Compulsions: No  Neurologic: Headache: No Seizure: No Paresthesias: No  Medical History:  Patient has history of obesity, diabetes mellitus, hypertension, stage IV renal insufficiency and hyperlipidemia.    Psychosocial history. Patient was born in [place] and raised in [place].  He is living in [place] since age 26.  Patient never married.  He has no children.  Patient lives by himself in his apartment.  His mother lives in [place] with her partner.  Patient has a sister however patient does not get along with her.  Patient has no knowledge about his biological father  patient told that his father disowned him.  Outpatient Encounter Prescriptions as of 10/04/2013  Medication Sig  . amLODipine (NORVASC) 10 MG tablet Take 10 mg by mouth daily.  . cholecalciferol (VITAMIN D) 1000 UNITS tablet Take 1,000 Units by mouth daily.  Marland Kitchen glipiZIDE (GLUCOTROL) 5 MG tablet Take 5 mg by mouth daily before breakfast.  . LANTUS SOLOSTAR 100 UNIT/ML injection Inject 28 Units into the skin at bedtime.   . Multiple Vitamin (MULTIVITAMIN WITH MINERALS) TABS tablet Take 1 tablet by mouth daily.  . risperiDONE (RISPERDAL) 2 MG tablet Take 1 tablet (2 mg total)  by mouth at bedtime.  . simvastatin (ZOCOR) 20 MG tablet Take 20 mg by mouth daily.  Marland Kitchen torsemide (DEMADEX) 20 MG tablet Take 20 mg by mouth daily.  . [DISCONTINUED] risperiDONE (RISPERDAL) 2 MG tablet Take 1 tablet (2 mg total)  by mouth at bedtime.    Past Psychiatric History/Hospitalization(s): Patient started to have psychiatric illness at age 28.  patient has history of paranoia, mania, psychosis and disorganized thinking. He has at least 5 psychiatric hospitalization mostly due to noncompliance with medication.  He was seeing [facility] for many years until recently changed to [facility]. In the past he had tried Haldol which he actually likes but when the dose was increased to 15 mg he became zombie and stopped. He has taken lithium in the past this was stopped because of renal insufficiency.  He was also given Imitriptyline by Dr. Milagros Loll.   Anxiety: No Bipolar Disorder: Yes Depression: Yes Mania: Yes Psychosis: Yes Schizophrenia: Yes Personality  Disorder: No Hospitalization for psychiatric illness: He has at least 5 psychiatric hospitalization.  His last hospitalization was in October 2012 at old [facility] History of Electroconvulsive Shock Therapy: No Prior Suicide Attempts: No  No results found for this or any previous visit (from the past 2160 hour(s)).  Physical  Exam: Constitutional:  BP 130/80  Pulse 100  Ht 5\' 11"  (1.803 m)  Wt 236 lb (107.049 kg)  BMI 32.93 kg/m2  General Appearance: well nourished, obese and Fairly groomed.  He maintained fair eye contact.  He is superficially cooperative and guarded.  Musculoskeletal: Strength & Muscle Tone: within normal limits Gait & Station: normal Patient leans: N/A  Mental Status Examination: Patient is casually dressed.  His speech is slow and he has some thought blocking but he denies any auditory or visual hallucination.  His attention and concentration is fair.  He denies any paranoia or any delusions but remains very guarded.  He has no tremors or shakes.  His fund of knowledge is below average.  His psychomotor activity is slightly decreased.  He has no flight of ideas or any loose association.  His memory is intact.  He is alert and oriented x3.  He denies any suicidal thoughts or homicidal thoughts.  His insight judgment and impulse control is okay.  Established Problem, Stable/Improving (1), Review of Psycho-Social Stressors (1), Review of Last Therapy Session (1) and Review of Medication Regimen & Side Effects (2)  Assessment: Axis I: Schizoaffective disorder bipolar type  Axis II: Deferred  Axis III:  Patient Active Problem List   Diagnosis Date Noted  . End stage renal disease 07/07/2013  . Chronic kidney disease, stage IV (severe) 05/05/2013  . Chronic kidney disease (CKD), stage IV (severe) 10/11/2012  . Obesity, unspecified 08/09/2012  . Diabetes mellitus 08/09/2012  . HTN (hypertension) 08/09/2012  . Hyperlipidemia 08/09/2012    Axis IV: Mild to moderate  Axis V: 50-55   Plan:  Patient is stable on his current medication. I will contiune his risperidone 2 mg at present dose.   he does not have any tremors or shakes.  Discussed risks and benefits of medication.  Recommend to call us back if he has any question or a concern.  Followup in 3 months.  Socorro Ebron T.,  MD 10/04/2013

## 2013-12-29 ENCOUNTER — Ambulatory Visit (HOSPITAL_COMMUNITY): Payer: Self-pay | Admitting: Psychiatry

## 2014-01-02 ENCOUNTER — Ambulatory Visit (HOSPITAL_COMMUNITY): Payer: Self-pay | Admitting: Psychiatry

## 2014-02-05 ENCOUNTER — Other Ambulatory Visit (HOSPITAL_COMMUNITY): Payer: Self-pay | Admitting: Psychiatry

## 2014-02-06 ENCOUNTER — Encounter (HOSPITAL_COMMUNITY): Payer: Self-pay | Admitting: Psychiatry

## 2014-02-06 ENCOUNTER — Other Ambulatory Visit (HOSPITAL_COMMUNITY): Payer: Self-pay | Admitting: Psychiatry

## 2014-02-06 ENCOUNTER — Ambulatory Visit (INDEPENDENT_AMBULATORY_CARE_PROVIDER_SITE_OTHER): Payer: 59 | Admitting: Psychiatry

## 2014-02-06 VITALS — BP 136/85 | HR 105 | Wt 248.0 lb

## 2014-02-06 DIAGNOSIS — F319 Bipolar disorder, unspecified: Secondary | ICD-10-CM

## 2014-02-06 DIAGNOSIS — F259 Schizoaffective disorder, unspecified: Secondary | ICD-10-CM

## 2014-02-06 MED ORDER — RISPERIDONE 2 MG PO TABS: ORAL_TABLET | ORAL | Status: DC

## 2014-02-06 NOTE — Progress Notes (Signed)
[place] [facility] Note   Kevin Deleon [id number] 43 y.o.  02/06/2014 2:47 PM  Chief Complaint:  Medication management and followup.    History of Present Illness: Kevin Deleon came for his followup appointment with his mother.  He is taking his medication as prescribed.  He denies any agitation, anger, paranoia.  He is sleeping good.  He has no issues with his neighbors.  He is scheduled to see his primary care physician next month and he will get blood work.  Patient and his mother told that his nephrologist is happy about his kidney function and so far there is no immediate plan for dialysis.  Patient is not drinking alcohol using any illegal substances.  He continues to smoke electronic cigarettes .  Patient denies any tremors or shakes.  His vitals are stable.  He lives by himself.  He is single and he has no children.  He never married.  His mother is very involved in his treatment plan.  Suicidal Ideation: No Plan Formed: No Patient has means to carry out plan: No  Homicidal Ideation: No Plan Formed: No Patient has means to carry out plan: No  Review of Systems: Psychiatric: Agitation: No Hallucination: No Depressed Mood: No Insomnia: No Hypersomnia: No Altered Concentration: No Feels Worthless: No Grandiose Ideas: No Belief In Special Powers: No New/Increased Substance Abuse: No Compulsions: No  Neurologic: Headache: No Seizure: No Paresthesias: No  Medical History:  Patient has history of obesity, diabetes mellitus, hypertension, stage IV renal insufficiency and hyperlipidemia.    Outpatient Encounter Prescriptions as of 02/06/2014  Medication Sig  . amLODipine (NORVASC) 10 MG tablet Take 10 mg by mouth daily.  . cholecalciferol (VITAMIN D) 1000 UNITS tablet Take 1,000 Units by mouth daily.  Marland Kitchen glipiZIDE (GLUCOTROL) 5 MG tablet Take 5 mg by mouth daily before breakfast.  . LANTUS SOLOSTAR 100 UNIT/ML injection Inject 28 Units into the skin at  bedtime.   . Multiple Vitamin (MULTIVITAMIN WITH MINERALS) TABS tablet Take 1 tablet by mouth daily.  . risperiDONE (RISPERDAL) 2 MG tablet Take 1 tablet (2 mg total)  by mouth at bedtime.  . simvastatin (ZOCOR) 20 MG tablet Take 20 mg by mouth daily.  Marland Kitchen torsemide (DEMADEX) 20 MG tablet Take 20 mg by mouth daily.  . [DISCONTINUED] risperiDONE (RISPERDAL) 2 MG tablet Take 1 tablet (2 mg total)  by mouth at bedtime.    Past Psychiatric History/Hospitalization(s): Patient has psychiatric illness since age 48.  patient has history of paranoia, mania, psychosis and disorganized thinking. He has at least 5 psychiatric hospitalization mostly due to noncompliance with medication.  He was seeing [facility] for many years until recently changed to [facility]. In the past he had tried Haldol which he actually likes but when the dose was increased to 15 mg he became zombie and stopped. He has taken lithium in the past this was stopped because of renal insufficiency.  He was also given Imitriptyline by Dr. Milagros Loll.   Anxiety: No Bipolar Disorder: Yes Depression: Yes Mania: Yes Psychosis: Yes Schizophrenia: Yes Personality Disorder: No Hospitalization for psychiatric illness: He has at least 5 psychiatric hospitalization.  His last hospitalization was in October 2012 at old [facility] History of Electroconvulsive Shock Therapy: No Prior Suicide Attempts: No  No results found for this or any previous visit (from the past 2160 hour(s)).  Physical Exam: Constitutional:  BP 136/85  Pulse 105  Wt 248 lb (112.492 kg)  General Appearance: well  nourished, obese and Fairly groomed.  He maintained fair eye contact.  He is superficially cooperative and guarded.  Musculoskeletal: Strength & Muscle Tone: within normal limits Gait & Station: normal Patient leans: N/A  Mental Status Examination: Patient is casually dressed.  He maintained fair eye contact.  He is cooperative.  He  denies any auditory or visual hallucination.  His attention and concentration is fair.  He denies any paranoia or any delusions but remains guarded sometime.  He has no tremors or shakes.  His fund of knowledge is below average.  His psychomotor activity is slightly decreased.  He has no flight of ideas or any loose association.  His memory is intact.  He is alert and oriented x3.  He denies any suicidal thoughts or homicidal thoughts.  His insight judgment and impulse control is okay.  Established Problem, Stable/Improving (1), Review of Psycho-Social Stressors (1), Review of Last Therapy Session (1) and Review of Medication Regimen & Side Effects (2)  Assessment: Axis I: Schizoaffective disorder bipolar type  Axis II: Deferred  Axis III:  Patient Active Problem List   Diagnosis Date Noted  . End stage renal disease 07/07/2013  . Chronic kidney disease, stage IV (severe) 05/05/2013  . Chronic kidney disease (CKD), stage IV (severe) 10/11/2012  . Obesity, unspecified 08/09/2012  . Diabetes mellitus 08/09/2012  . HTN (hypertension) 08/09/2012  . Hyperlipidemia 08/09/2012    Axis IV: Mild to moderate  Axis V: 50-55   Plan:  Patient is stable on his current medication. I will contiune his risperidone 2 mg at present dose.  Patient does not have any side effects. Discussed risks and benefits of medication.  Recommend to call us back if he has any question or a concern.  Followup in 3 months.  [place] Maugeri T., MD 02/06/2014

## 2014-03-05 ENCOUNTER — Other Ambulatory Visit (HOSPITAL_COMMUNITY): Payer: Self-pay | Admitting: Psychiatry

## 2014-04-16 ENCOUNTER — Other Ambulatory Visit (HOSPITAL_COMMUNITY): Payer: Self-pay | Admitting: Psychiatry

## 2014-04-18 ENCOUNTER — Other Ambulatory Visit (HOSPITAL_COMMUNITY): Payer: Self-pay | Admitting: Psychiatry

## 2014-05-08 ENCOUNTER — Ambulatory Visit (HOSPITAL_COMMUNITY): Payer: Self-pay | Admitting: Psychiatry

## 2014-05-30 ENCOUNTER — Ambulatory Visit (INDEPENDENT_AMBULATORY_CARE_PROVIDER_SITE_OTHER): Payer: 59 | Admitting: Psychiatry

## 2014-05-30 ENCOUNTER — Encounter (HOSPITAL_COMMUNITY): Payer: Self-pay | Admitting: Psychiatry

## 2014-05-30 VITALS — BP 122/73 | HR 88 | Ht 71.0 in | Wt 236.2 lb

## 2014-05-30 DIAGNOSIS — F250 Schizoaffective disorder, bipolar type: Secondary | ICD-10-CM

## 2014-05-30 MED ORDER — RISPERIDONE 2 MG PO TABS: 2.0000 mg | ORAL_TABLET | Freq: Every day | ORAL | Status: DC

## 2014-05-30 NOTE — Progress Notes (Signed)
[place] [facility]   Kevin Deleon Kevin Deleon 44 y.o.  05/30/2014 2:26 PM  Chief Complaint:  Medication management and followup.    History of Present Illness: Kevin Deleon came for his followup appointment with his mother.  He had a good Christmas .  He is compliant with his medication and denies any side effects.  He saw his nephrologist in November and there has been no changes.  He is scheduled to see his primary care physician on February 1 and again nephrologist on February 22.  He has lost weight from the past and he is excited about it.  Patient denies any irritability, anger, mood swing.  He sleeping good.  He has no rage or any anger issues.  His mother endorse much improvement in his behavior and there has been no recent concern.  Patient denies drinking or using any illegal substances.  He has no tremors or shakes.  Patient lives by himself.  He is single and he has no children.  Suicidal Ideation: No Plan Formed: No Patient has means to carry out plan: No  Homicidal Ideation: No Plan Formed: No Patient has means to carry out plan: No  Review of Systems: Psychiatric: Agitation: No Hallucination: No Depressed Mood: No Insomnia: No Hypersomnia: No Altered Concentration: No Feels Worthless: No Grandiose Ideas: No Belief In Special Powers: No New/Increased Substance Abuse: No Compulsions: No  Neurologic: Headache: No Seizure: No Paresthesias: No  Medical History:  Patient has history of obesity, diabetes mellitus, hypertension, stage IV renal insufficiency and hyperlipidemia.    Outpatient Encounter Prescriptions as of 05/30/2014  Medication Sig  . amLODipine (NORVASC) 10 MG tablet Take 10 mg by mouth daily.  . cholecalciferol (VITAMIN D) 1000 UNITS tablet Take 1,000 Units by mouth daily.  Marland Kitchen glipiZIDE (GLUCOTROL) 5 MG tablet Take 5 mg by mouth daily before breakfast.  . LANTUS SOLOSTAR 100 UNIT/ML injection Inject 28 Units into the skin at  bedtime.   . Multiple Vitamin (MULTIVITAMIN WITH MINERALS) TABS tablet Take 1 tablet by mouth daily.  . risperiDONE (RISPERDAL) 2 MG tablet Take 1 tablet (2 mg total) by mouth at bedtime.  . simvastatin (ZOCOR) 20 MG tablet Take 20 mg by mouth daily.  Marland Kitchen torsemide (DEMADEX) 20 MG tablet Take 20 mg by mouth daily.  . [DISCONTINUED] risperiDONE (RISPERDAL) 2 MG tablet Take 1 tablet by mouth at  bedtime.    Past Psychiatric History/Hospitalization(s): Patient has psychiatric illness since age 65.  He has paranoia, mania, psychosis and disorganized thinking. He has at least 5 psychiatric hospitalization mostly due to noncompliance with medication.  He was seeing [facility] mental health for many years. In the past he had tried Haldol which he actually likes but when the dose was increased to 15 mg he became zombie and stopped. He has taken lithium in the past this was stopped because of renal insufficiency.  He was also given Imitriptyline by Dr. Milagros Loll.   Anxiety: No Bipolar Disorder: Yes Depression: Yes Mania: Yes Psychosis: Yes Schizophrenia: Yes Personality Disorder: No Hospitalization for psychiatric illness: He has at least 5 psychiatric hospitalization.  His last hospitalization was in October 2012 at old [facility] History of Electroconvulsive Shock Therapy: No Prior Suicide Attempts: No  No results found for this or any previous visit (from the past 2160 hour(s)).  Physical Exam: Constitutional:  BP 122/73 mmHg  Pulse 88  Ht 5\' 11"  (1.803 m)  Wt 236 lb 3.2 oz (107.14 kg)  BMI 32.96  kg/m2  General Appearance: alert, oriented, no acute distress and well nourished  Musculoskeletal: Strength & Muscle Tone: within normal limits Gait & Station: normal Patient leans: N/A  Mental Status Examination: Patient is casually dressed.  He maintained fair eye contact.  He is cooperative.  He denies any auditory or visual hallucination.  His attention and concentration is  fair.  He denies any paranoia or any delusions but remains guarded sometime.  He has no tremors or shakes.  His fund of knowledge is below average.  His psychomotor activity is slightly decreased.  He has no flight of ideas or any loose association.  His memory is intact.  He is alert and oriented x3.  He denies any suicidal thoughts or homicidal thoughts.  His insight judgment and impulse control is okay.  Established Problem, Stable/Improving (1), Review of Psycho-Social Stressors (1), Review of Last Therapy Session (1) and Review of Medication Regimen & Side Effects (2)  Assessment: Axis I: Schizoaffective disorder bipolar type  Axis II: Deferred  Axis III:  Patient Active Problem List   Diagnosis Date Noted  . End stage renal disease 07/07/2013  . Chronic kidney disease, stage IV (severe) 05/05/2013  . Chronic kidney disease (CKD), stage IV (severe) 10/11/2012  . Obesity, unspecified 08/09/2012  . Diabetes mellitus 08/09/2012  . HTN (hypertension) 08/09/2012  . Hyperlipidemia 08/09/2012    Axis IV: Mild   Plan:  Patient is currently stable on his medication.  He has no tremors shakes or any side effects.  I will contiune his risperidone 2 mg at bedtime.  Discussed risks and benefits of medication.  Recommend to call us back if he has any question or a concern.  Followup in 3 months.  Marcell Chavarin T., MD 05/30/2014

## 2014-08-01 ENCOUNTER — Other Ambulatory Visit (HOSPITAL_COMMUNITY): Payer: Self-pay | Admitting: Psychiatry

## 2014-08-01 NOTE — Telephone Encounter (Signed)
Called patient.  Mother stated he has his medication for March, will receive tomorrow.  Mother stated she requested [facility] to send [place] the refill request to make sure her son will have his medication in April.

## 2014-08-29 ENCOUNTER — Encounter (HOSPITAL_COMMUNITY): Payer: Self-pay | Admitting: Psychiatry

## 2014-08-29 ENCOUNTER — Ambulatory Visit (INDEPENDENT_AMBULATORY_CARE_PROVIDER_SITE_OTHER): Payer: 59 | Admitting: Psychiatry

## 2014-08-29 VITALS — BP 126/77 | HR 95 | Ht 70.0 in | Wt 234.6 lb

## 2014-08-29 DIAGNOSIS — F250 Schizoaffective disorder, bipolar type: Secondary | ICD-10-CM

## 2014-08-29 MED ORDER — RISPERIDONE 2 MG PO TABS: 2.0000 mg | ORAL_TABLET | Freq: Every day | ORAL | Status: DC

## 2014-08-29 NOTE — Progress Notes (Signed)
[name] Note   [name] [id number] 44 y.o.  08/29/2014 1:16 PM  Chief Complaint:  Medication management and followup.    History of Present Illness: Kevin Deleon came for his followup appointment with his mother.  He is taking his medication as prescribed.  He denies any irritability, anger, paranoia or any hallucination.  His sleep is good.  He saw his nephrologist on [place] and there has been no changes in his medication.  His BUN is 37 and creatinine 4.71.  He is not sure if he will require dialysis anytime soon.  His mother endorsed improvement in his mood and anger.  There has been no recent incident or any agitation.  He believe Risperdal is helping his anger and paranoia.  His appetite is okay.  His vitals are stable.  Patient lives by himself however he usually comes with his mother who lives near [place].  Patient is single and he has no children.  He denies drinking or using any illegal substances.  Suicidal Ideation: No Plan Formed: No Patient has means to carry out plan: No  Homicidal Ideation: No Plan Formed: No Patient has means to carry out plan: No  Review of Systems: Psychiatric: Agitation: No Hallucination: No Depressed Mood: No Insomnia: No Hypersomnia: No Altered Concentration: No Feels Worthless: No Grandiose Ideas: No Belief In Special Powers: No New/Increased Substance Abuse: No Compulsions: No  Neurologic: Headache: No Seizure: No Paresthesias: No  Medical History:  Patient has history of obesity, diabetes mellitus, hypertension, stage IV renal insufficiency and hyperlipidemia.    Outpatient Encounter Prescriptions as of 08/29/2014  Medication Sig  . amLODipine (NORVASC) 10 MG tablet Take 10 mg by mouth daily.  . cholecalciferol (VITAMIN D) 1000 UNITS tablet Take 1,000 Units by mouth daily.  Marland Kitchen glipiZIDE (GLUCOTROL) 5 MG tablet Take 5 mg by mouth daily before breakfast.  . LANTUS SOLOSTAR 100 UNIT/ML injection Inject  28 Units into the skin at bedtime.   . Multiple Vitamin (MULTIVITAMIN WITH MINERALS) TABS tablet Take 1 tablet by mouth daily.  . risperiDONE (RISPERDAL) 2 MG tablet Take 1 tablet (2 mg total) by mouth at bedtime.  . simvastatin (ZOCOR) 20 MG tablet Take 20 mg by mouth daily.  Marland Kitchen torsemide (DEMADEX) 20 MG tablet Take 20 mg by mouth daily.  . [DISCONTINUED] risperiDONE (RISPERDAL) 2 MG tablet Take 1 tablet (2 mg total) by mouth at bedtime.    Past Psychiatric History/Hospitalization(s): Patient has psychiatric illness since age 21.  He has paranoia, mania, psychosis and disorganized thinking. He has at least 5 psychiatric hospitalization mostly due to noncompliance with medication.  He was seeing [facility] mental health for many years. In the past he had tried Haldol which he actually likes but when the dose was increased to 15 mg he became zombie and stopped. He has taken lithium in the past this was stopped because of renal insufficiency.  He was also given Imitriptyline by Dr. Milagros Loll.   Anxiety: No Bipolar Disorder: Yes Depression: Yes Mania: Yes Psychosis: Yes Schizophrenia: Yes Personality Disorder: No Hospitalization for psychiatric illness: He has at least 5 psychiatric hospitalization.  His last hospitalization was in October 2012 at old [facility] History of Electroconvulsive Shock Therapy: No Prior Suicide Attempts: No  No results found for this or any previous visit (from the past 2160 hour(s)).  Physical Exam: Constitutional:  BP 126/77 mmHg  Pulse 95  Ht 5\' 10"  (1.778 m)  Wt 234 lb 9.6 oz (106.414  kg)  BMI 33.66 kg/m2  General Appearance: alert, oriented, no acute distress and well nourished  Musculoskeletal: Strength & Muscle Tone: within normal limits Gait & Station: normal Patient leans: N/A  Mental Status Examination: Patient is casually dressed.  He maintained fair eye contact.  He is cooperative.  He denies any auditory or visual hallucination.   His attention and concentration is fair.  He denies any paranoia or any delusions but remains guarded sometime.  He has no tremors or shakes.  His fund of knowledge is below average.  His psychomotor activity is slightly decreased.  He has no flight of ideas or any loose association.  His memory is intact.  He is alert and oriented x3.  He denies any suicidal thoughts or homicidal thoughts.  His insight judgment and impulse control is okay.  Established Problem, Stable/Improving (1), Review of Psycho-Social Stressors (1), Review of Last Therapy Session (1) and Review of Medication Regimen & Side Effects (2)  Assessment: Axis I: Schizoaffective disorder bipolar type  Axis II: Deferred  Axis III:  Patient Active Problem List   Diagnosis Date Noted  . End stage renal disease 07/07/2013  . Chronic kidney disease, stage IV (severe) 05/05/2013  . Chronic kidney disease (CKD), stage IV (severe) 10/11/2012  . Obesity, unspecified 08/09/2012  . Diabetes mellitus 08/09/2012  . HTN (hypertension) 08/09/2012  . Hyperlipidemia 08/09/2012    Plan:  Patient is currently stable on his medication.  He has no tremors shakes or any side effects.  Continue Risperdal 2 mg at bedtime.  Discussed risks and benefits of medication.  Recommend to call us back if he has any question or a concern.  Followup in 3 months.  ARFEEN,SYED T., MD 08/29/2014

## 2014-10-18 ENCOUNTER — Other Ambulatory Visit (HOSPITAL_COMMUNITY): Payer: Self-pay | Admitting: Psychiatry

## 2014-11-04 ENCOUNTER — Other Ambulatory Visit (HOSPITAL_COMMUNITY): Payer: Self-pay | Admitting: Psychiatry

## 2014-11-05 ENCOUNTER — Other Ambulatory Visit (HOSPITAL_COMMUNITY): Payer: Self-pay | Admitting: Psychiatry

## 2014-11-28 ENCOUNTER — Ambulatory Visit (HOSPITAL_COMMUNITY): Payer: Self-pay | Admitting: Psychiatry

## 2014-12-05 ENCOUNTER — Encounter (HOSPITAL_COMMUNITY): Payer: Self-pay | Admitting: Psychiatry

## 2014-12-05 ENCOUNTER — Ambulatory Visit (INDEPENDENT_AMBULATORY_CARE_PROVIDER_SITE_OTHER): Payer: 59 | Admitting: Psychiatry

## 2014-12-05 VITALS — BP 130/84 | HR 84 | Temp 98.7°F | Resp 18 | Ht 70.0 in | Wt 239.8 lb

## 2014-12-05 DIAGNOSIS — F250 Schizoaffective disorder, bipolar type: Secondary | ICD-10-CM

## 2014-12-05 MED ORDER — RISPERIDONE 2 MG PO TABS
ORAL_TABLET | ORAL | Status: DC
Start: 2014-12-05 — End: 2015-03-07

## 2014-12-05 NOTE — Progress Notes (Signed)
[place] [facility] Note   Kevin Deleon [id number] 44 y.o.  12/05/2014 3:25 PM  Chief Complaint:  Medication management and followup.    History of Present Illness: Kevin Deleon came for his followup appointment with his mother.  His mother moved to [place] .  She reported patient is doing better on his current medication.  He is scheduled to see his primary care physician Dr. Orland Mustard next week .  She also mentioned that his kidney functions are unchanged from the past.  Patient also admitted doing better on his current medication.  He denies any irritability, anger, mood swing.  He sleeping good.  There has been no recent agitation or any paranoid thinking.  His appetite is okay.  His vitals are stable.  He has good energy level.  His last BUN was 37 and creatinine 4.71 however he is going to see his primary care physician for blood work next week.  He likes respite all to milligram at bedtime.  He has no tremors shakes or any EPS.  Patient lives by himself however he usually comes with his mother who recently moved from [place] area to [place].   Patient is single and he has no children.  He denies drinking or using any illegal substances.  Suicidal Ideation: No Plan Formed: No Patient has means to carry out plan: No  Homicidal Ideation: No Plan Formed: No Patient has means to carry out plan: No  Review of Systems: Psychiatric: Agitation: No Hallucination: No Depressed Mood: No Insomnia: No Hypersomnia: No Altered Concentration: No Feels Worthless: No Grandiose Ideas: No Belief In Special Powers: No New/Increased Substance Abuse: No Compulsions: No  Neurologic: Headache: No Seizure: No Paresthesias: No  Medical History:  Patient has history of obesity, diabetes mellitus, hypertension, stage IV renal insufficiency and hyperlipidemia.    Outpatient Encounter Prescriptions as of 12/05/2014  Medication Sig  . amLODipine (NORVASC) 10 MG tablet Take 10 mg by  mouth daily.  . cholecalciferol (VITAMIN D) 1000 UNITS tablet Take 1,000 Units by mouth daily.  Marland Kitchen glipiZIDE (GLUCOTROL) 5 MG tablet Take 5 mg by mouth daily before breakfast.  . LANTUS SOLOSTAR 100 UNIT/ML injection Inject 28 Units into the skin at bedtime.   . Multiple Vitamin (MULTIVITAMIN WITH MINERALS) TABS tablet Take 1 tablet by mouth daily.  . risperiDONE (RISPERDAL) 2 MG tablet Take 1 tablet by mouth at  bedtime  . simvastatin (ZOCOR) 20 MG tablet Take 20 mg by mouth daily.  Marland Kitchen torsemide (DEMADEX) 20 MG tablet Take 20 mg by mouth daily.  . [DISCONTINUED] risperiDONE (RISPERDAL) 2 MG tablet Take 1 tablet by mouth at  bedtime   No facility-administered encounter medications on file as of 12/05/2014.    Past Psychiatric History/Hospitalization(s): Patient has psychiatric illness since age 40.  He has paranoia, mania, psychosis and disorganized thinking. He has at least 5 psychiatric hospitalization mostly due to noncompliance with medication.  He was seeing [facility] mental health for many years. In the past he had tried Haldol which he actually likes but when the dose was increased to 15 mg he became zombie and stopped. He has taken lithium in the past this was stopped because of renal insufficiency.  He was also given Imitriptyline by Dr. Milagros Loll.   Anxiety: No Bipolar Disorder: Yes Depression: Yes Mania: Yes Psychosis: Yes Schizophrenia: Yes Personality Disorder: No Hospitalization for psychiatric illness: He has at least 5 psychiatric hospitalization.  His last hospitalization was in October 2012 at old [facility]  History of Electroconvulsive Shock Therapy: No Prior Suicide Attempts: No  No results found for this or any previous visit (from the past 2160 hour(s)).  Physical Exam: Constitutional:  BP 130/84 mmHg  Pulse 84  Temp(Src) 98.7 F (37.1 C)  Resp 18  Ht 5\' 10"  (1.778 m)  Wt 239 lb 12.8 oz (108.773 kg)  BMI 34.41 kg/m2  SpO2 100%  General  Appearance: alert, oriented, no acute distress and well nourished  Musculoskeletal: Strength & Muscle Tone: within normal limits Gait & Station: normal Patient leans: N/A  Mental Status Examination: Patient is casually dressed.  He maintained fair eye contact.  He is cooperative.  He denies any auditory or visual hallucination.  His attention and concentration is fair.  He denies any paranoia or any delusions but remains guarded sometime.  He has no tremors or shakes.  His fund of knowledge is average.  His psychomotor activity is slow. He has no flight of ideas or any loose association.  His memory is intact.  He is alert and oriented x3.  He denies any suicidal thoughts or homicidal thoughts.  His insight judgment and impulse control is okay.  Established Problem, Stable/Improving (1), Review of Psycho-Social Stressors (1), Review of Last Therapy Session (1) and Review of Medication Regimen & Side Effects (2)  Assessment: Axis I: Schizoaffective disorder bipolar type  Axis II: Deferred  Axis III:  Patient Active Problem List   Diagnosis Date Noted  . End stage renal disease 07/07/2013  . Chronic kidney disease, stage IV (severe) 05/05/2013  . Chronic kidney disease (CKD), stage IV (severe) 10/11/2012  . Obesity, unspecified 08/09/2012  . Diabetes mellitus 08/09/2012  . HTN (hypertension) 08/09/2012  . Hyperlipidemia 08/09/2012    Plan:  Patient is currently stable on his medication.  I recommended once he had blood work been results should be faxed to our office .  At this time I will continue his current psychiatric medication.  He has no side effects.  Continue Risperdal 2 mg at bedtime.  Discussed risks and benefits of medication.  Recommend to call us back if he has any question or a concern.  Followup in 3 months.  Shady Padron T., MD 12/05/2014

## 2015-02-07 ENCOUNTER — Other Ambulatory Visit (HOSPITAL_COMMUNITY): Payer: Self-pay | Admitting: Psychiatry

## 2015-02-08 NOTE — Telephone Encounter (Signed)
Refill request for Risperdal requested too early as last order for 90 days e-scribed 12/05/14 and patient returns for evaluation on 03/07/15.

## 2015-03-07 ENCOUNTER — Encounter (HOSPITAL_COMMUNITY): Payer: Self-pay | Admitting: Psychiatry

## 2015-03-07 ENCOUNTER — Ambulatory Visit (INDEPENDENT_AMBULATORY_CARE_PROVIDER_SITE_OTHER): Payer: 59 | Admitting: Psychiatry

## 2015-03-07 VITALS — BP 124/83 | HR 92 | Ht 70.0 in | Wt 238.4 lb

## 2015-03-07 DIAGNOSIS — F250 Schizoaffective disorder, bipolar type: Secondary | ICD-10-CM

## 2015-03-07 MED ORDER — RISPERIDONE 2 MG PO TABS: ORAL_TABLET | ORAL | Status: DC

## 2015-03-07 NOTE — Progress Notes (Signed)
[place] [facility] Note   DWRIGHT HABEL [id number] 44 y.o.  03/07/2015 2:40 PM  Chief Complaint:  Medication management and followup.    History of Present Illness: Kevin Deleon came for his followup appointment with his mother.  He is taking his medication as prescribed.  He denies any irritability, anger, mood swing.  His mother reported that patient is doing much better and is stable since he is compliant with Risperdal for long time.  He has no issues with the neighbors.  He is going to his primary care physician for his appointment and also seeing his nephrologist .  His last blood work was drawn on May 4 and has creatinine 5.34 and BUN 49.  Patient denies any paranoia or any impulsive behavior.  He likes to stay himself and does not leave the house unless it is important.  He has no tremors, shakes or any EPS from Risperdal.  Patient lives by himself however his mother is very involved in his treatment plan.  Patient denies drinking or using any illegal substances.  Patient is single and he has no children.  He wants to continue Risperdal .  Suicidal Ideation: No Plan Formed: No Patient has means to carry out plan: No  Homicidal Ideation: No Plan Formed: No Patient has means to carry out plan: No  Review of Systems: Psychiatric: Agitation: No Hallucination: No Depressed Mood: No Insomnia: No Hypersomnia: No Altered Concentration: No Feels Worthless: No Grandiose Ideas: No Belief In Special Powers: No New/Increased Substance Abuse: No Compulsions: No  Neurologic: Headache: No Seizure: No Paresthesias: No  Medical History:  Patient has obesity, diabetes mellitus, hypertension, vitamin D deficiency , stage V renal insufficiency and hyperlipidemia.  His last blood work was drawn on May 4 which shows creatinine 5.34 and BUN 140.  His sodium was 134 and potassium 3.8.  His primary care physician is Dr. Orland Mustard.  His nephrologist is Dr. Joelyn Oms.  Outpatient  Encounter Prescriptions as of 03/07/2015  Medication Sig  . amLODipine (NORVASC) 10 MG tablet Take 10 mg by mouth daily.  . cholecalciferol (VITAMIN D) 1000 UNITS tablet Take 1,000 Units by mouth daily.  Marland Kitchen glipiZIDE (GLUCOTROL) 5 MG tablet Take 5 mg by mouth daily before breakfast.  . LANTUS SOLOSTAR 100 UNIT/ML injection Inject 28 Units into the skin at bedtime.   . Multiple Vitamin (MULTIVITAMIN WITH MINERALS) TABS tablet Take 1 tablet by mouth daily.  . risperiDONE (RISPERDAL) 2 MG tablet Take 1 tablet by mouth at  bedtime  . simvastatin (ZOCOR) 20 MG tablet Take 20 mg by mouth daily.  Marland Kitchen torsemide (DEMADEX) 20 MG tablet Take 20 mg by mouth daily.  . [DISCONTINUED] risperiDONE (RISPERDAL) 2 MG tablet Take 1 tablet by mouth at  bedtime   No facility-administered encounter medications on file as of 03/07/2015.    Past Psychiatric History/Hospitalization(s): Patient has psychiatric illness since age 64.  He has paranoia, mania, psychosis and disorganized thinking. He has at least 5 psychiatric hospitalization mostly due to noncompliance with medication.  He was seeing [facility] mental health for many years. In the past he had tried Haldol which he actually likes but when the dose was increased to 15 mg he became zombie and stopped. He has taken lithium in the past this was stopped because of renal insufficiency.  He was also given Imitriptyline by Dr. Milagros Loll.   Anxiety: No Bipolar Disorder: Yes Depression: Yes Mania: Yes Psychosis: Yes Schizophrenia: Yes Personality Disorder: No Hospitalization for psychiatric  illness: He has at least 5 psychiatric hospitalization.  His last hospitalization was in October 2012 at old [facility] History of Electroconvulsive Shock Therapy: No Prior Suicide Attempts: No  No results found for this or any previous visit (from the past 2160 hour(s)).  Physical Exam: Constitutional:  BP 124/83 mmHg  Pulse 92  Ht 5\' 10"  (1.778 m)  Wt 238 lb  6.4 oz (108.138 kg)  BMI 34.21 kg/m2  General Appearance: alert, oriented, no acute distress and well nourished  Musculoskeletal: Strength & Muscle Tone: within normal limits Gait & Station: normal Patient leans: N/A  Mental Status Examination: Patient is casually dressed.  He maintained fair eye contact.  He is cooperative.  His speech is slow but decreased volume and tone.  He has poverty of thought content.  He denies any auditory or visual hallucination.  His attention and concentration is fair.  He denies any paranoia or any delusions. He has no tremors or shakes.  His fund of knowledge is average.  His psychomotor activity is slow. He has no flight of ideas or any loose association.  His memory is intact.  He is alert and oriented x3.  He denies any suicidal thoughts or homicidal thoughts.  His insight judgment and impulse control is okay.  Established Problem, Stable/Improving (1), Review of Psycho-Social Stressors (1), Review or order clinical lab tests (1), Review of Last Therapy Session (1) and Review of Medication Regimen & Side Effects (2)  Assessment: Axis I: Schizoaffective disorder bipolar type  Axis II: Deferred  Axis III:  Patient Active Problem List   Diagnosis Date Noted  . End stage renal disease ([place]) 07/07/2013  . Chronic kidney disease, stage IV (severe) ([place]) 05/05/2013  . Chronic kidney disease (CKD), stage IV (severe) ([place]) 10/11/2012  . Obesity, unspecified 08/09/2012  . Diabetes mellitus ([place]) 08/09/2012  . HTN (hypertension) 08/09/2012  . Hyperlipidemia 08/09/2012    Plan:  Patient is currently stable on his medication.  I reviewed his blood work results.  His creatinine is 5.34 but stable.  Discussed medication side effects and benefits.  At this time he does not have any side effects.  I will continue Risperdal 2 mg at bedtime.  Recommended to call us back if he has any question or any concern.  Follow-up in 3 months.  ARFEEN,SYED T.,  MD 03/07/2015

## 2015-04-03 ENCOUNTER — Other Ambulatory Visit (HOSPITAL_COMMUNITY): Payer: Self-pay | Admitting: Psychiatry

## 2015-04-15 ENCOUNTER — Encounter: Payer: Self-pay | Admitting: Psychiatry

## 2015-05-22 ENCOUNTER — Other Ambulatory Visit (HOSPITAL_COMMUNITY): Payer: Self-pay | Admitting: Psychiatry

## 2015-05-28 ENCOUNTER — Other Ambulatory Visit (HOSPITAL_COMMUNITY): Payer: Self-pay | Admitting: Psychiatry

## 2015-05-29 NOTE — Telephone Encounter (Signed)
Telephone call with patient's Mother and guardian to question if patient would be in need of a new Risperdal order prior to appointment on 06/06/15.  Collateral stated she was not sure since medications come from [facility] but agreed to call back if will not have enough until that date.  Informed last order e-scribed in on 03/07/15 for 90 days so patient should have enough until appointment so collateral will call back if patient will run out early.

## 2015-06-06 ENCOUNTER — Encounter (HOSPITAL_COMMUNITY): Payer: Self-pay | Admitting: Psychiatry

## 2015-06-06 ENCOUNTER — Ambulatory Visit (INDEPENDENT_AMBULATORY_CARE_PROVIDER_SITE_OTHER): Payer: 59 | Admitting: Psychiatry

## 2015-06-06 VITALS — BP 119/79 | HR 86 | Ht 71.0 in | Wt 239.0 lb

## 2015-06-06 DIAGNOSIS — F250 Schizoaffective disorder, bipolar type: Secondary | ICD-10-CM

## 2015-06-06 MED ORDER — RISPERIDONE 2 MG PO TABS: ORAL_TABLET | ORAL | Status: DC

## 2015-06-06 NOTE — Progress Notes (Signed)
[facility] Note   ANEL ANGSTADT JD:[id number] y.o.  06/06/2015 4:29 PM  Chief Complaint:  Medication management and followup.    History of Present Illness: Kevin Deleon came for his followup appointment with his mother.  He had a good Christmas.  He visited his mother on Christmas time.  He is compliant with Risperdal and denies any side effects.  He has no tremors shakes or any EPS.  He described his mood is a stable and he is not involved in any agitation, paranoia or any aggressive behavior.  He is getting along with his neighbors.  He sleeping good and denies any feeling of hopelessness or worthlessness.  He is scheduled to see his primary care physician on February 13 for blood work and then nephrologist on therapy 20th.  Patient denies drinking or using any illegal substances.  His appetite is okay.  His vitals are stable.  He wants to continue Risperdal at bedtime.  Patient is single and he has no children.   Suicidal Ideation: No Plan Formed: No Patient has means to carry out plan: No  Homicidal Ideation: No Plan Formed: No Patient has means to carry out plan: No  Review of Systems: Psychiatric: Agitation: No Hallucination: No Depressed Mood: No Insomnia: No Hypersomnia: No Altered Concentration: No Feels Worthless: No Grandiose Ideas: No Belief In Special Powers: No New/Increased Substance Abuse: No Compulsions: No  Neurologic: Headache: No Seizure: No Paresthesias: No  Medical History:  Patient has obesity, diabetes mellitus, hypertension, vitamin D deficiency , stage V renal insufficiency and hyperlipidemia.  His last blood work results on Sep 19, 2014 shows creatinine 5.34 and BUN 140.  His sodium was 134 and potassium 3.8.  His primary care physician is Dr. Orland Mustard.  His nephrologist is Dr. Joelyn Oms.  Outpatient Encounter Prescriptions as of 06/06/2015  Medication Sig  . amLODipine (NORVASC) 10 MG tablet Take 10 mg by mouth daily.  .  cholecalciferol (VITAMIN D) 1000 UNITS tablet Take 1,000 Units by mouth daily.  Marland Kitchen glipiZIDE (GLUCOTROL) 5 MG tablet Take 5 mg by mouth daily before breakfast.  . LANTUS SOLOSTAR 100 UNIT/ML injection Inject 28 Units into the skin at bedtime.   . Multiple Vitamin (MULTIVITAMIN WITH MINERALS) TABS tablet Take 1 tablet by mouth daily.  . risperiDONE (RISPERDAL) 2 MG tablet Take 1 tablet by mouth at  bedtime  . simvastatin (ZOCOR) 20 MG tablet Take 20 mg by mouth daily.  Marland Kitchen torsemide (DEMADEX) 20 MG tablet Take 20 mg by mouth daily.  . [DISCONTINUED] risperiDONE (RISPERDAL) 2 MG tablet Take 1 tablet by mouth at  bedtime   No facility-administered encounter medications on file as of 06/06/2015.    Past Psychiatric History/Hospitalization(s): Patient has psychiatric illness since age 28.  He has paranoia, mania, psychosis and disorganized thinking. He has at least 5 psychiatric hospitalization mostly due to noncompliance with medication.  He was seeing [facility] mental health for many years. In the past he had tried Haldol which he actually likes but when the dose was increased to 15 mg he became zombie and stopped. He has taken lithium in the past this was stopped because of renal insufficiency.  He was also given Imitriptyline by Dr. Milagros Loll.   Anxiety: No Bipolar Disorder: Yes Depression: Yes Mania: Yes Psychosis: Yes Schizophrenia: Yes Personality Disorder: No Hospitalization for psychiatric illness: He has at least 5 psychiatric hospitalization.  His last hospitalization was in October 2012 at old [facility] History of Electroconvulsive Shock  Therapy: No Prior Suicide Attempts: No  No results found for this or any previous visit (from the past 2160 hour(s)).  Physical Exam: Constitutional:  BP 119/79 mmHg  Pulse 86  Ht 5\' 11"  (1.803 m)  Wt 239 lb (108.41 kg)  BMI 33.35 kg/m2  General Appearance: alert, oriented, no acute distress and well  nourished  Musculoskeletal: Strength & Muscle Tone: within normal limits Gait & Station: normal Patient leans: N/A  Mental Status Examination: Patient is casually dressed.  He is cooperative and maintained fair eye contact.  His speech is slow but decreased volume and tone.  He has poverty of thought content.  He denies any auditory or visual hallucination.  His attention and concentration is fair.  He denies any paranoia or any delusions. He has no tremors or shakes.  His fund of knowledge is average.  His psychomotor activity is slow. He has no flight of ideas or any loose association.  His memory is intact.  He is alert and oriented x3.  He denies any suicidal thoughts or homicidal thoughts.  His insight judgment and impulse control is okay.  Established Problem, Stable/Improving (1), Review of Psycho-Social Stressors (1), Review of Last Therapy Session (1) and Review of Medication Regimen & Side Effects (2)  Assessment: Axis I: Schizoaffective disorder bipolar type  Axis II: Deferred  Axis III:  Patient Active Problem List   Diagnosis Date Noted  . End stage renal disease ([place]) 07/07/2013  . Chronic kidney disease, stage IV (severe) ([place]) 05/05/2013  . Chronic kidney disease (CKD), stage IV (severe) ([place]) 10/11/2012  . Obesity, unspecified 08/09/2012  . Diabetes mellitus ([place]) 08/09/2012  . HTN (hypertension) 08/09/2012  . Hyperlipidemia 08/09/2012    Plan:  Patient is currently stable on his medication.   I will continue Risperdal 2 mg at bedtime.  Patient will get his blood work next month .  I recommended to have his physician send [place] a blood work results.  Discussed medication side effects and benefits.  Recommended to call us back if he has any question or any concern.  Follow-up in 3 months.  Uri Turnbough T., MD 06/06/2015

## 2015-07-09 ENCOUNTER — Other Ambulatory Visit (HOSPITAL_COMMUNITY): Payer: Self-pay | Admitting: Psychiatry

## 2015-09-04 ENCOUNTER — Ambulatory Visit (INDEPENDENT_AMBULATORY_CARE_PROVIDER_SITE_OTHER): Payer: 59 | Admitting: Psychiatry

## 2015-09-04 ENCOUNTER — Encounter (HOSPITAL_COMMUNITY): Payer: Self-pay | Admitting: Psychiatry

## 2015-09-04 VITALS — BP 118/72 | HR 77 | Ht 70.0 in | Wt 239.2 lb

## 2015-09-04 DIAGNOSIS — F250 Schizoaffective disorder, bipolar type: Secondary | ICD-10-CM

## 2015-09-04 MED ORDER — RISPERIDONE 2 MG PO TABS: ORAL_TABLET | ORAL | Status: DC

## 2015-09-04 NOTE — Progress Notes (Signed)
[facility] Note   XZORION LAUR [id number] 45 y.o.  09/04/2015 3:13 PM  Chief Complaint:  Medication management and followup.    History of Present Illness: Kevin Deleon came for his followup appointment with his mother.  He is taking Risperdal as prescribed.  He denies any paranoia, delusion, irritability or any aggressive behavior.  His mother endorsed that he is getting along very well with his neighbors.  He sleeping good.  He has no tremors, shakes or any EPS.  He recently seen his nephrologist and he had blood work.  His last creatinine was 7 and BUN 54.  He mentioned his hemoglobin A1c was around 6.5 but he had appointment to see his primary care physician in few weeks.  Patient is a stable on Risperdal and he likes to continue his current psychiatric medication.  He has no muscle stiffness.  His appetite is okay.  His vitals are stable.  Patient denies drinking alcohol or using any illegal substances.  His energy level is okay.  He usually stays home by himself.  He is single and he has no children.  His vitals are stable.  Suicidal Ideation: No Plan Formed: No Patient has means to carry out plan: No  Homicidal Ideation: No Plan Formed: No Patient has means to carry out plan: No  Review of Systems: Psychiatric: Agitation: No Hallucination: No Depressed Mood: No Insomnia: No Hypersomnia: No Altered Concentration: No Feels Worthless: No Grandiose Ideas: No Belief In Special Powers: No New/Increased Substance Abuse: No Compulsions: No  Neurologic: Headache: No Seizure: No Paresthesias: No  Medical History:  Patient has obesity, diabetes mellitus, hypertension, vitamin D deficiency , stage V renal insufficiency and hyperlipidemia.  His last blood work results on Sep 19, 2014 shows creatinine 5.34 and BUN 140.  His sodium was 134 and potassium 3.8.  His primary care physician is Dr. Orland Mustard.  His nephrologist is Dr. Joelyn Oms.  Outpatient Encounter  Prescriptions as of 09/04/2015  Medication Sig  . amLODipine (NORVASC) 10 MG tablet Take 10 mg by mouth daily.  . cholecalciferol (VITAMIN D) 1000 UNITS tablet Take 1,000 Units by mouth daily.  Marland Kitchen glipiZIDE (GLUCOTROL) 5 MG tablet Take 5 mg by mouth daily before breakfast.  . LANTUS SOLOSTAR 100 UNIT/ML injection Inject 28 Units into the skin at bedtime.   . Multiple Vitamin (MULTIVITAMIN WITH MINERALS) TABS tablet Take 1 tablet by mouth daily.  . risperiDONE (RISPERDAL) 2 MG tablet Take 1 tablet by mouth at  bedtime  . simvastatin (ZOCOR) 20 MG tablet Take 20 mg by mouth daily.  Marland Kitchen torsemide (DEMADEX) 20 MG tablet Take 20 mg by mouth daily.  . [DISCONTINUED] risperiDONE (RISPERDAL) 2 MG tablet Take 1 tablet by mouth at  bedtime   No facility-administered encounter medications on file as of 09/04/2015.    Past Psychiatric History/Hospitalization(s): Patient has psychiatric illness since age 17.  He has paranoia, mania, psychosis and disorganized thinking. He has at least 5 psychiatric hospitalization mostly due to noncompliance with medication.  He was seeing [facility] mental health for many years. In the past he had tried Haldol which he actually likes but when the dose was increased to 15 mg he became zombie and stopped. He has taken lithium in the past this was stopped because of renal insufficiency.  He was also given Imitriptyline by Dr. Milagros Loll.   Anxiety: No Bipolar Disorder: Yes Depression: Yes Mania: Yes Psychosis: Yes Schizophrenia: Yes Personality Disorder: No Hospitalization for psychiatric illness: He  has at least 5 psychiatric hospitalization.  His last hospitalization was in October 2012 at old [facility] History of Electroconvulsive Shock Therapy: No Prior Suicide Attempts: No  No results found for this or any previous visit (from the past 2160 hour(s)).  Physical Exam: Constitutional:  BP 118/72 mmHg  Pulse 77  Ht 5\' 10"  (1.778 m)  Wt 239 lb 3.2 oz  (108.5 kg)  BMI 34.32 kg/m2  General Appearance: alert, oriented, no acute distress and well nourished  Musculoskeletal: Strength & Muscle Tone: within normal limits Gait & Station: normal Patient leans: N/A  Mental Status Examination: Patient is casually dressed.  He is cooperative and maintained fair eye contact.  His speech is slow but decreased volume and tone.  He has poverty of thought content.  He denies any auditory or visual hallucination.  His attention and concentration is fair.  He denies any paranoia or any delusions. He has no tremors or shakes.  His fund of knowledge is average.  His psychomotor activity is slow. He has no flight of ideas or any loose association.  His memory is intact.  He is alert and oriented x3.  He denies any suicidal thoughts or homicidal thoughts.  His insight judgment and impulse control is okay.  Established Problem, Stable/Improving (1), Review of Psycho-Social Stressors (1), Review or order clinical lab tests (1), Review of Last Therapy Session (1) and Review of Medication Regimen & Side Effects (2)  Assessment: Axis I: Schizoaffective disorder bipolar type  Axis II: Deferred  Axis III:  Patient Active Problem List   Diagnosis Date Noted  . End stage renal disease ([place]) 07/07/2013  . Chronic kidney disease, stage IV (severe) ([place]) 05/05/2013  . Chronic kidney disease (CKD), stage IV (severe) ([place]) 10/11/2012  . Obesity, unspecified 08/09/2012  . Diabetes mellitus ([place]) 08/09/2012  . HTN (hypertension) 08/09/2012  . Hyperlipidemia 08/09/2012    Plan:  Patient is currently stable on his medication.   I will continue Risperdal 2 mg at bedtime.  I review her last blood work which was done in March 2017.  His creatinine is 7 and his BUN is 54.  Discussed medication side effects and benefits.  Recommended to call us back if he has any question or any concern.  Follow-up in 3 months.  Kevin Deleon T., MD 09/04/2015

## 2015-12-03 ENCOUNTER — Other Ambulatory Visit (HOSPITAL_COMMUNITY): Payer: Self-pay

## 2015-12-03 ENCOUNTER — Other Ambulatory Visit (HOSPITAL_COMMUNITY): Payer: Self-pay | Admitting: Psychiatry

## 2015-12-03 DIAGNOSIS — F250 Schizoaffective disorder, bipolar type: Secondary | ICD-10-CM

## 2015-12-03 MED ORDER — RISPERIDONE 2 MG PO TABS
ORAL_TABLET | ORAL | Status: DC
Start: 2015-12-03 — End: 2016-02-24

## 2015-12-05 ENCOUNTER — Ambulatory Visit (HOSPITAL_COMMUNITY): Payer: Self-pay | Admitting: Psychiatry

## 2016-01-23 ENCOUNTER — Ambulatory Visit (HOSPITAL_COMMUNITY): Payer: Self-pay | Admitting: Psychiatry

## 2016-01-24 ENCOUNTER — Other Ambulatory Visit (HOSPITAL_COMMUNITY): Payer: Self-pay | Admitting: Psychiatry

## 2016-01-24 DIAGNOSIS — F250 Schizoaffective disorder, bipolar type: Secondary | ICD-10-CM

## 2016-02-03 ENCOUNTER — Ambulatory Visit (HOSPITAL_COMMUNITY): Payer: Self-pay | Admitting: Psychiatry

## 2016-02-12 ENCOUNTER — Other Ambulatory Visit: Payer: Self-pay | Admitting: Orthopedic Surgery

## 2016-02-12 DIAGNOSIS — M25521 Pain in right elbow: Secondary | ICD-10-CM

## 2016-02-14 ENCOUNTER — Ambulatory Visit
Admission: RE | Admit: 2016-02-14 | Discharge: 2016-02-14 | Disposition: A | Discharge status: Home / Self Care | Payer: Self-pay | Source: Ambulatory Visit | Attending: Orthopedic Surgery | Admitting: Orthopedic Surgery

## 2016-02-14 DIAGNOSIS — M25521 Pain in right elbow: Secondary | ICD-10-CM

## 2016-02-23 NOTE — Progress Notes (Addendum)
[place] Progress Note   Kevin Deleon [id number] 45 y.o.  02/24/2016 10:51 AM  Chief Complaint:  Medication management and follow up.    History of Present Illness: Tramond came for his follow up appointment with his mother. Patient has been doing well since the last visit. He denies any paranoia, AH/VH or mood symptoms. He denies insomnia. He has no problems taking his medication.  His mother, guardian states that he has been doing well. He will have dialysis tomorrow. His mother denies any paranoia, delusion, AH/VH, mood symptoms. He has been adherent to his medication. His mother has no concern for him living by himself. His lipid panel, HbA1c has been followed by his PCP.  Suicidal Ideation: No Plan Formed: No Patient has means to carry out plan: No  Homicidal Ideation: No Plan Formed: No Patient has means to carry out plan: No  Review of Systems: Psychiatric: Agitation: No Hallucination: No Depressed Mood: No Insomnia: No Hypersomnia: No Altered Concentration: No Feels Worthless: No Grandiose Ideas: No Belief In Special Powers: No New/Increased Substance Abuse: No Compulsions: No  Neurologic: Headache: No Seizure: No Paresthesias: No  Medical History:  Patient has obesity, diabetes mellitus, hypertension, vitamin D deficiency , stage V renal insufficiency and hyperlipidemia.  His last blood work results on Sep 19, 2014 shows creatinine 5.34 and BUN 140.  His sodium was 134 and potassium 3.8.  His primary care physician is Dr. Orland Mustard.  His nephrologist is Dr. Joelyn Oms.  Outpatient Encounter Prescriptions as of 02/24/2016  Medication Sig Dispense Refill  . sodium bicarbonate 650 MG tablet Take 650 mg by mouth 2 (two) times daily.    Marland Kitchen amLODipine (NORVASC) 10 MG tablet Take 10 mg by mouth daily.    . cholecalciferol (VITAMIN D) 1000 UNITS tablet Take 1,000 Units by mouth daily.    Marland Kitchen glipiZIDE (GLUCOTROL) 5 MG tablet Take 5 mg by mouth daily before  breakfast.    . LANTUS SOLOSTAR 100 UNIT/ML injection Inject 28 Units into the skin at bedtime.     . Multiple Vitamin (MULTIVITAMIN WITH MINERALS) TABS tablet Take 1 tablet by mouth daily.    . risperiDONE (RISPERDAL) 2 MG tablet Take 1 tablet by mouth at  bedtime 90 tablet 0  . simvastatin (ZOCOR) 20 MG tablet Take 20 mg by mouth daily.    Marland Kitchen torsemide (DEMADEX) 20 MG tablet Take 20 mg by mouth daily.    . [DISCONTINUED] risperiDONE (RISPERDAL) 2 MG tablet Take 1 tablet by mouth at  bedtime 90 tablet 0   No facility-administered encounter medications on file as of 02/24/2016.     Past Psychiatric History/Hospitalization(s): Patient has psychiatric illness since age 51.  He has paranoia, mania, psychosis and disorganized thinking. He has at least 5 psychiatric hospitalization mostly due to noncompliance with medication.  He was seeing [facility] mental health for many years. In the past he had tried Haldol which he actually likes but when the dose was increased to 15 mg he became zombie and stopped. He has taken lithium in the past this was stopped because of renal insufficiency.  He was also given Amitriptyline by Dr. Milagros Loll.    Anxiety: No Bipolar Disorder: Yes Depression: Yes Mania: Yes Psychosis: Yes Schizophrenia: Yes Personality Disorder: No Hospitalization for psychiatric illness: He has at least 5 psychiatric hospitalization.  His last hospitalization was in October 2012 at old [facility] History of Electroconvulsive Shock Therapy: No Prior Suicide Attempts: No  No results found for  this or any previous visit (from the past 2160 hour(s)).  Physical Exam: Constitutional:  BP (!) 142/88   Pulse 78   Ht 5\' 10"  (1.778 m)   Wt 221 lb 12.8 oz (100.6 kg)   BMI 31.82 kg/m   General Appearance: alert, oriented, no acute distress and well nourished  Musculoskeletal: Strength & Muscle Tone: within normal limits Gait & Station: normal Patient leans: N/A  Mental  Status Examination: Patient is casually dressed.  He is cooperative and maintained fair eye contact.  His speech is normal rate, volume, latency, tone.   He has poverty of thought content.  He denies any auditory or visual hallucination.  His attention and concentration is fair.  He denies any paranoia or any delusions. He has no tremors or rigidity. His fund of knowledge is average.  His psychomotor activity is slow. He has no flight of ideas or any loose association.  His memory is intact.  He is alert and oriented x3.  He denies any suicidal thoughts or homicidal thoughts.  His insight judgment and impulse control is okay.  Established Problem, Stable/Improving (1), Review of Psycho-Social Stressors (1), Review or order clinical lab tests (1), Review of Last Therapy Session (1) and Review of Medication Regimen & Side Effects (2)  Assessment: Kevin Deleon is a 45 year old male with schizoaffective disorder, ESRD, hypertension, hyperlipidemia, diabetes mellitus who presents for follow up appointment. He is one of Dr. Marguerite Olea patients.   Patient and his mother, guardian reports no change in his mood and no psychotic symptoms observed on today's evaluation. Will continue current regimen. Patient will have dialysis tomorrow for the first time; discussed to call the clinic if any changes in his mood/psychotic symptoms, as there can be changes in blood level of risperidone after dialysis.   Plan:  - Continue risperidone 2 mg qhs - RTC in 3 months  The patient demonstrates the following  risk factors for suicide: Chronic risk factors for suicide include psychiatric disorder, ESRD. Acute risk factors for suicide include medical problems.  Protective factors for this patient include positive social support, hope for the future.  Considering these factors, the overall suicide risk at this point appears to be low.  Norman Clay, MD 02/24/2016

## 2016-02-24 ENCOUNTER — Encounter (HOSPITAL_COMMUNITY): Payer: Self-pay | Admitting: Psychiatry

## 2016-02-24 ENCOUNTER — Ambulatory Visit (INDEPENDENT_AMBULATORY_CARE_PROVIDER_SITE_OTHER): Payer: 59 | Admitting: Psychiatry

## 2016-02-24 DIAGNOSIS — F250 Schizoaffective disorder, bipolar type: Secondary | ICD-10-CM | POA: Diagnosis not present

## 2016-02-24 MED ORDER — RISPERIDONE 2 MG PO TABS: ORAL_TABLET | ORAL | 0 refills | Status: DC

## 2016-02-24 NOTE — Patient Instructions (Signed)
1. Continue Risperidone 2 mg at night 2. Return to clinic in 3 months

## 2016-03-03 ENCOUNTER — Encounter: Payer: Self-pay | Admitting: Psychiatry

## 2016-06-03 ENCOUNTER — Ambulatory Visit (HOSPITAL_COMMUNITY): Payer: Self-pay | Admitting: Psychiatry

## 2016-06-09 ENCOUNTER — Telehealth (HOSPITAL_COMMUNITY): Payer: Self-pay

## 2016-06-09 NOTE — Telephone Encounter (Signed)
Medication management - Telephone message from patient's Mother requesting a callback from Dr. Adele Schilder.  Stated she wanted to provide Dr. Adele Schilder with information about patient prior to his appt. 06/15/16.  States she would like to share information about patient with Dr. Adele Schilder without patient present.  Called Ms. Hackney back as she states concern for patient getting his medication completely now that he is on Dialysis and would like to discuss this with Dr. Adele Schilder.  Agreed to pass on the request as collateral stated she would be available this evening, not on Wednesday but then again on Thursday and Friday and requests Dr. Adele Schilder call her to discuss.

## 2016-06-10 NOTE — Telephone Encounter (Signed)
Medication management  - Ms. Hackney left a message stating she is unavailable today but requests Dr. Adele Schilder call her back on tomorrow or Friday of this week before patient comes in for appointment 2/29/18 at 8:45 am.

## 2016-06-15 ENCOUNTER — Ambulatory Visit (INDEPENDENT_AMBULATORY_CARE_PROVIDER_SITE_OTHER): Payer: 59 | Admitting: Psychiatry

## 2016-06-15 ENCOUNTER — Encounter (HOSPITAL_COMMUNITY): Payer: Self-pay | Admitting: Psychiatry

## 2016-06-15 DIAGNOSIS — Z79899 Other long term (current) drug therapy: Secondary | ICD-10-CM

## 2016-06-15 DIAGNOSIS — Z9889 Other specified postprocedural states: Secondary | ICD-10-CM

## 2016-06-15 DIAGNOSIS — F250 Schizoaffective disorder, bipolar type: Secondary | ICD-10-CM

## 2016-06-15 DIAGNOSIS — Z801 Family history of malignant neoplasm of trachea, bronchus and lung: Secondary | ICD-10-CM | POA: Diagnosis not present

## 2016-06-15 DIAGNOSIS — F17210 Nicotine dependence, cigarettes, uncomplicated: Secondary | ICD-10-CM | POA: Diagnosis not present

## 2016-06-15 MED ORDER — RISPERIDONE 2 MG PO TABS: ORAL_TABLET | ORAL | 0 refills | Status: DC

## 2016-06-15 NOTE — Progress Notes (Signed)
[facility] MD/PA/NP OP Progress Note  06/15/2016 10:00 AM Kevin Deleon  MRN:  [id number]  Chief Complaint:  Chief Complaint    Follow-up     Subjective:  I'm doing fine.  I am on dialysis.  HPI: Kevin Deleon came for his follow-up appointment with his mother.  He started dialysis since October and he is tolerating his treatment without any issues.  His mother is somewhat frustrated with lack of communication from dialysis Center.  Apparently his blood pressure medicines were discontinued but she was not informed.  Patient's mother is legal guardian.  Overall patient reported his mood is a stable.  He denies any irritability, anger, mania or any psychosis.  He sleeping good.  He denies any irritability or any suicidal thoughts.  He is taking Risperdal and reported no side effects.  He lives by himself and he has no issues with the neighbors.  He is not involved in any argument or any paranoid behavior.  He has no tremors shakes or any EPS.  He admitted getting frustrated with transportation because sometimes he has to wait more than 2 hours for [facility] services who brings home after dialysis.  Patient is not listed for transplant because he is still has issues with his diet.  He is no longer seeing Dr. Joelyn Oms for his kidney issues since starting the dialysis.  He is seeing Dr Dederdine at [facility] who is managing his medication.  Patient denies drinking alcohol or using any illegal substances.  Visit Diagnosis:    ICD-9-CM ICD-10-CM   1. Schizoaffective disorder, bipolar type ([place]) 295.70 F25.0 risperiDONE (RISPERDAL) 2 MG tablet    Past Psychiatric History: Reviewed. Patient has psychiatric illness since age 51.  He has paranoia, mania, psychosis and disorganized thinking. He has at least 5 psychiatric hospitalization mostly due to noncompliance with medication.  He was seeing [facility] mental health for many years. In the past he had tried Haldol which he actually likes but when the dose was  increased to 15 mg he became zombie and stopped. He has taken lithium in the past this was stopped because of renal insufficiency.  He was also given Amitriptyline by Dr. Milagros Loll.    Past Medical History:  Patient has obesity, diabetes mellitus, hypertension, vitamin D deficiency , stage V renal insufficiency and hyperlipidemia.  His last blood work results on Sep 19, 2014 shows creatinine 5.34 and BUN 140.  His sodium was 134 and potassium 3.8.  His primary care physician is Dr. Orland Mustard. Past Medical History:  Diagnosis Date  . Chronic kidney disease   . Diabetes mellitus ([place])   . HTN (hypertension)   . Hyperlipidemia   . Liver disease   . Mental disorder    Schizo- affective disorder    Past Surgical History:  Procedure Laterality Date  . AV FISTULA PLACEMENT Left 05/24/2013   Procedure: ARTERIOVENOUS (AV) FISTULA CREATION BRACHIAL-CEPHALIC;  Surgeon: Conrad Morocco, MD;  Location: [facility];  Service: Vascular;  Laterality: Left;    Family Psychiatric History: Reviewed.  Family History:  Family History  Problem Relation Age of Onset  . Cancer - Lung Other   . Cancer Other     Social History:  Social History   Social History  . Marital status: Single    Spouse name: N/A  . Number of children: N/A  . Years of education: N/A   Social History Main Topics  . Smoking status: Current Every Day Smoker    Packs/day: 0.50  Types: Cigarettes  . Smokeless tobacco: Never Used  . Alcohol use No  . Drug use: No  . Sexual activity: Not Asked   Other Topics Concern  . None   Social History Narrative  . None    Allergies: No Known Allergies  Metabolic Disorder Labs: No results found for: HGBA1C, MPG No results found for: PROLACTIN No results found for: CHOL, TRIG, HDL, CHOLHDL, VLDL, LDLCALC   Current Medications: Current Outpatient Prescriptions  Medication Sig Dispense Refill  . glipiZIDE (GLUCOTROL) 5 MG tablet Take 5 mg by mouth daily before breakfast.    . lanthanum  (FOSRENOL) 1000 MG chewable tablet     . LANTUS SOLOSTAR 100 UNIT/ML injection Inject 28 Units into the skin at bedtime.     . Multiple Vitamin (MULTIVITAMIN WITH MINERALS) TABS tablet Take 1 tablet by mouth daily.    . ONE TOUCH ULTRA TEST test strip     . risperiDONE (RISPERDAL) 2 MG tablet Take 1 tablet by mouth at  bedtime 90 tablet 0  . simvastatin (ZOCOR) 20 MG tablet Take 20 mg by mouth daily.    . sodium bicarbonate 650 MG tablet Take 650 mg by mouth 2 (two) times daily.    Marland Kitchen amLODipine (NORVASC) 10 MG tablet Take 10 mg by mouth daily.    . cholecalciferol (VITAMIN D) 1000 UNITS tablet Take 1,000 Units by mouth daily.     No current facility-administered medications for this visit.     Neurologic: Headache: No Seizure: No Paresthesias: No  Musculoskeletal: Strength & Muscle Tone: within normal limits Gait & Station: normal Patient leans: N/A  Psychiatric Specialty Exam: ROS  Blood pressure 136/82, pulse 98, height 5\' 11"  (1.803 m), weight 228 lb 9.6 oz (103.7 kg).Body mass index is 31.88 kg/m.  General Appearance: Casual  Eye Contact:  Fair  Speech:  Slow  Volume:  Decreased  Mood:  Euthymic  Affect:  Flat  Thought Process:  Goal Directed  Orientation:  Full (Time, Place, and Person)  Thought Content: WDL, Logical and Poverty of thought content   Suicidal Thoughts:  No  Homicidal Thoughts:  No  Memory:  Immediate;   Fair Recent;   Fair Remote;   Fair  Judgement:  Fair  Insight:  Fair  Psychomotor Activity:  Normal  Concentration:  Concentration: Fair and Attention Span: Fair  Recall:  [employer] of Knowledge: Good  Language: Fair  Akathisia:  No  Handed:  Right  AIMS (if indicated):  0  Assets:  Communication Skills Desire for Improvement Housing Social Support  ADL's:  Intact  Cognition: WNL  Sleep:  good   Assessment: Schizoaffective disorder depressed type.  Plan: Patient is a stable on his current dose of Risperdal.  He has no side effects.   He is tolerating and adjusting his treatment at dialysis Center.  We will contact the center to get most accurate medication.  His mother is also visiting his physician tomorrow at 3 and she will call us to give [place] an update.  At this time I offered counseling but patient declined. Discussed medication side effects and benefits.  Recommended to call us back if there is any question, concern or worsening of the symptoms.  Discuss safety plan that anytime having active suicidal thoughts or homicidal thoughts and she need to call 911 or go to the local emergency room.  Ariyel Jeangilles T., MD 06/15/2016, 10:00 AM

## 2016-08-03 ENCOUNTER — Telehealth (HOSPITAL_COMMUNITY): Payer: Self-pay

## 2016-08-03 NOTE — Telephone Encounter (Signed)
He can try trazodone 50-100 mg at bedtime for insomnia and if that did not help him and he can come for early appointment.

## 2016-08-03 NOTE — Telephone Encounter (Signed)
Patients mother is calling, she said that the patient says the Risperidone is keeping him up at night. She is concerned that the medication is not staying in his system due to dialysis and would like to know if they should come in sooner than the scheduled 4/30 appt. Please review and advise, thank you

## 2016-08-05 MED ORDER — TRAZODONE HCL 50 MG PO TABS: ORAL_TABLET | ORAL | 1 refills | Status: DC

## 2016-08-05 NOTE — Telephone Encounter (Signed)
Sent prescription to the pharmacy and lvm for patients mother to let her know

## 2016-08-06 ENCOUNTER — Encounter (HOSPITAL_COMMUNITY): Payer: Self-pay | Admitting: Psychiatry

## 2016-08-12 ENCOUNTER — Ambulatory Visit: Payer: Self-pay | Admitting: Otolaryngology

## 2016-08-12 ENCOUNTER — Encounter (HOSPITAL_BASED_OUTPATIENT_CLINIC_OR_DEPARTMENT_OTHER): Payer: Self-pay | Admitting: *Deleted

## 2016-08-12 ENCOUNTER — Other Ambulatory Visit (HOSPITAL_COMMUNITY): Payer: Self-pay | Admitting: Otolaryngology

## 2016-08-12 DIAGNOSIS — C321 Malignant neoplasm of supraglottis: Secondary | ICD-10-CM

## 2016-08-12 NOTE — H&P (Signed)
PREOPERATIVE H&P  Chief Complaint: chronic sore throat  HPI: Kevin Deleon is a 46 y.o. male who presents for evaluation of of chronic sore throat. He's had a chronic sore throat for several weeks that has been getting worse. He's had negative strept test. 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 He's taken to the OR for DL/BX.  Past Medical History:  Diagnosis Date  . Chronic kidney disease   . Diabetes mellitus ([place])   . HTN (hypertension)   . Hyperlipidemia   . Liver disease   . Mental disorder    Schizo- affective disorder   Past Surgical History:  Procedure Laterality Date  . AV FISTULA PLACEMENT Left 05/24/2013   Procedure: ARTERIOVENOUS (AV) FISTULA CREATION BRACHIAL-CEPHALIC;  Surgeon: Conrad Plainview, MD;  Location: [facility];  Service: Vascular;  Laterality: Left;   Social History   Social History  . Marital status: Single    Spouse name: N/A  . Number of children: N/A  . Years of education: N/A   Social History Main Topics  . Smoking status: Current Every Day Smoker    Packs/day: 0.50    Types: Cigarettes  . Smokeless tobacco: Never Used  . Alcohol use No  . Drug use: No  . Sexual activity: Not on file   Other Topics Concern  . Not on file   Social History Narrative  . No narrative on file   Family History  Problem Relation Age of Onset  . Cancer - Lung Other   . Cancer Other    No Known Allergies Prior to Admission medications   Medication Sig Start Date End Date Taking? Authorizing Provider  amLODipine (NORVASC) 10 MG tablet Take 10 mg by mouth daily.    Historical Provider, MD  cholecalciferol (VITAMIN D) 1000 UNITS tablet Take 1,000 Units by mouth daily.    Historical Provider, MD  glipiZIDE (GLUCOTROL) 5 MG tablet Take 5 mg by mouth daily before breakfast.    Historical Provider, MD  lanthanum (FOSRENOL) 1000 MG chewable tablet  04/20/16   Historical Provider, MD  LANTUS SOLOSTAR 100 UNIT/ML injection  Inject 28 Units into the skin at bedtime.  05/30/12   Historical Provider, MD  Multiple Vitamin (MULTIVITAMIN WITH MINERALS) TABS tablet Take 1 tablet by mouth daily.    Historical Provider, MD  ONE TOUCH ULTRA TEST test strip  04/10/16   Historical Provider, MD  risperiDONE (RISPERDAL) 2 MG tablet Take 1 tablet by mouth at  bedtime 06/15/16   Kathlee Nations, MD  simvastatin (ZOCOR) 20 MG tablet Take 20 mg by mouth daily. 06/21/12   Historical Provider, MD  sodium bicarbonate 650 MG tablet Take 650 mg by mouth 2 (two) times daily.    Historical Provider, MD  traZODone (DESYREL) 50 MG tablet Take 1 - 2 tablets at bedtime as needed for sleep 08/05/16   Kathlee Nations, MD     Positive ROS: Chronic sore throat  All other systems have been reviewed and were otherwise negative with the exception of those mentioned in the HPI and as above.  Physical Exam: There were no vitals filed for this visit.  General: Alert, no acute distress, no airway problems Oral: Normal oral mucosa and tonsils Nasal: Clear nasal passages,  FOL ulcer on laryngeal surface of the epiglottis, normal VCs Neck: No palpable adenopathy or thyroid nodules Ear: Ear canal is clear with normal appearing TMs Cardiovascular: Regular rate and rhythm, no  murmur.  Respiratory: Clear to auscultation Neurologic: Alert and oriented x 3   Assessment/Plan: DYSPHAGIA, HOARSENESS Plan for Procedure(s): DIRECT LARYNGOSCOPY and biopsy    Melony Overly, MD 08/12/2016 2:56 PM

## 2016-08-12 NOTE — Progress Notes (Signed)
Patient has hx schizo-affective disorder and mental retardation. Patient lives alone and mother Kevin Deleon is POA and legal guardian. He recieves dialysis Tue-Thur-Sat and will need istat and EKG DOS. Mother states he is cooperative, mostly noncompliant with meds and smokes about 1 1/2 ppd.

## 2016-08-14 ENCOUNTER — Ambulatory Visit
Admission: RE | Admit: 2016-08-14 | Discharge: 2016-08-14 | Disposition: A | Discharge status: Home / Self Care | Payer: Medicare Other | Source: Ambulatory Visit | Attending: Otolaryngology | Admitting: Otolaryngology

## 2016-08-14 ENCOUNTER — Encounter: Payer: Self-pay | Admitting: Radiation Oncology

## 2016-08-14 ENCOUNTER — Other Ambulatory Visit: Payer: Self-pay | Admitting: Otolaryngology

## 2016-08-14 DIAGNOSIS — R1310 Dysphagia, unspecified: Secondary | ICD-10-CM

## 2016-08-14 MED ORDER — IOPAMIDOL (ISOVUE-300) INJECTION 61%
75.0000 mL | Freq: Once | INTRAVENOUS | Status: AC | PRN
  Administered 2016-08-14: 75 mL via INTRAVENOUS

## 2016-08-17 ENCOUNTER — Encounter (HOSPITAL_BASED_OUTPATIENT_CLINIC_OR_DEPARTMENT_OTHER): Payer: Self-pay | Admitting: Anesthesiology

## 2016-08-17 ENCOUNTER — Ambulatory Visit (HOSPITAL_BASED_OUTPATIENT_CLINIC_OR_DEPARTMENT_OTHER)
Admission: RE | Admit: 2016-08-17 | Discharge: 2016-08-17 | Disposition: A | Discharge status: Home / Self Care | Payer: Medicare Other | Source: Ambulatory Visit | Attending: Otolaryngology | Admitting: Otolaryngology

## 2016-08-17 ENCOUNTER — Ambulatory Visit (HOSPITAL_BASED_OUTPATIENT_CLINIC_OR_DEPARTMENT_OTHER): Payer: Medicare Other | Admitting: Anesthesiology

## 2016-08-17 ENCOUNTER — Encounter (HOSPITAL_BASED_OUTPATIENT_CLINIC_OR_DEPARTMENT_OTHER)
Admission: RE | Disposition: A | Discharge status: Home / Self Care | Payer: Self-pay | Source: Ambulatory Visit | Attending: Otolaryngology

## 2016-08-17 DIAGNOSIS — J312 Chronic pharyngitis: Secondary | ICD-10-CM | POA: Insufficient documentation

## 2016-08-17 DIAGNOSIS — R229 Localized swelling, mass and lump, unspecified: Secondary | ICD-10-CM | POA: Diagnosis present

## 2016-08-17 DIAGNOSIS — C321 Malignant neoplasm of supraglottis: Secondary | ICD-10-CM | POA: Diagnosis not present

## 2016-08-17 DIAGNOSIS — N189 Chronic kidney disease, unspecified: Secondary | ICD-10-CM | POA: Insufficient documentation

## 2016-08-17 DIAGNOSIS — Z79899 Other long term (current) drug therapy: Secondary | ICD-10-CM | POA: Diagnosis not present

## 2016-08-17 DIAGNOSIS — E785 Hyperlipidemia, unspecified: Secondary | ICD-10-CM | POA: Insufficient documentation

## 2016-08-17 DIAGNOSIS — Z794 Long term (current) use of insulin: Secondary | ICD-10-CM | POA: Insufficient documentation

## 2016-08-17 DIAGNOSIS — F209 Schizophrenia, unspecified: Secondary | ICD-10-CM | POA: Insufficient documentation

## 2016-08-17 DIAGNOSIS — I129 Hypertensive chronic kidney disease with stage 1 through stage 4 chronic kidney disease, or unspecified chronic kidney disease: Secondary | ICD-10-CM | POA: Diagnosis not present

## 2016-08-17 DIAGNOSIS — E1122 Type 2 diabetes mellitus with diabetic chronic kidney disease: Secondary | ICD-10-CM | POA: Insufficient documentation

## 2016-08-17 DIAGNOSIS — F17210 Nicotine dependence, cigarettes, uncomplicated: Secondary | ICD-10-CM | POA: Diagnosis not present

## 2016-08-17 HISTORY — DX: Schizophrenia, unspecified: F20.9

## 2016-08-17 HISTORY — DX: Simple chronic bronchitis: J41.0

## 2016-08-17 HISTORY — DX: Gastro-esophageal reflux disease without esophagitis: K21.9

## 2016-08-17 HISTORY — DX: Nicotine dependence, unspecified, uncomplicated: F17.200

## 2016-08-17 HISTORY — PX: DIRECT LARYNGOSCOPY: SHX5326

## 2016-08-17 LAB — POCT I-STAT, CHEM 8
BUN: 40 mg/dL — AB (ref 6–20)
CALCIUM ION: 1.12 mmol/L — AB (ref 1.15–1.40)
CHLORIDE: 94 mmol/L — AB (ref 101–111)
CREATININE: 7.2 mg/dL — AB (ref 0.61–1.24)
GLUCOSE: 104 mg/dL — AB (ref 65–99)
HCT: 49 % (ref 39.0–52.0)
Hemoglobin: 16.7 g/dL (ref 13.0–17.0)
Potassium: 4.7 mmol/L (ref 3.5–5.1)
SODIUM: 132 mmol/L — AB (ref 135–145)
TCO2: 28 mmol/L (ref 0–100)

## 2016-08-17 LAB — GLUCOSE, CAPILLARY: Glucose-Capillary: 160 mg/dL — ABNORMAL HIGH (ref 65–99)

## 2016-08-17 SURGERY — LARYNGOSCOPY, DIRECT
Anesthesia: General | Site: Throat

## 2016-08-17 MED ORDER — DEXAMETHASONE SODIUM PHOSPHATE 4 MG/ML IJ SOLN
INTRAMUSCULAR | Status: DC | PRN
  Administered 2016-08-17: 10 mg via INTRAVENOUS

## 2016-08-17 MED ORDER — CEFAZOLIN SODIUM-DEXTROSE 2-4 GM/100ML-% IV SOLN
INTRAVENOUS | Status: AC
  Filled 2016-08-17: qty 100

## 2016-08-17 MED ORDER — ONDANSETRON HCL 4 MG/2ML IJ SOLN
INTRAMUSCULAR | Status: DC | PRN
  Administered 2016-08-17: 4 mg via INTRAVENOUS

## 2016-08-17 MED ORDER — CHLORHEXIDINE GLUCONATE CLOTH 2 % EX PADS: 6.0000 | MEDICATED_PAD | Freq: Once | CUTANEOUS | Status: DC

## 2016-08-17 MED ORDER — SODIUM CHLORIDE 0.9 % IV SOLN
INTRAVENOUS | Status: DC | PRN
  Administered 2016-08-17 (×2): via INTRAVENOUS

## 2016-08-17 MED ORDER — FENTANYL CITRATE (PF) 100 MCG/2ML IJ SOLN: 50.0000 ug | INTRAMUSCULAR | Status: DC | PRN

## 2016-08-17 MED ORDER — LIDOCAINE HCL (PF) 1 % IJ SOLN
INTRAMUSCULAR | Status: AC
  Filled 2016-08-17: qty 30

## 2016-08-17 MED ORDER — PROPOFOL 10 MG/ML IV BOLUS
INTRAVENOUS | Status: AC
  Filled 2016-08-17: qty 20

## 2016-08-17 MED ORDER — MIDAZOLAM HCL 5 MG/5ML IJ SOLN
INTRAMUSCULAR | Status: DC | PRN
  Administered 2016-08-17: 2 mg via INTRAVENOUS

## 2016-08-17 MED ORDER — LIDOCAINE-EPINEPHRINE 0.5 %-1:200000 IJ SOLN
INTRAMUSCULAR | Status: AC
  Filled 2016-08-17: qty 1

## 2016-08-17 MED ORDER — EPINEPHRINE 30 MG/30ML IJ SOLN
INTRAMUSCULAR | Status: AC
  Filled 2016-08-17: qty 1

## 2016-08-17 MED ORDER — BUPIVACAINE HCL (PF) 0.5 % IJ SOLN
INTRAMUSCULAR | Status: AC
  Filled 2016-08-17: qty 30

## 2016-08-17 MED ORDER — SODIUM CHLORIDE 0.9 % IV SOLN
INTRAVENOUS | Status: DC
  Administered 2016-08-17 (×2): via INTRAVENOUS

## 2016-08-17 MED ORDER — MIDAZOLAM HCL 2 MG/2ML IJ SOLN: 1.0000 mg | INTRAMUSCULAR | Status: DC | PRN

## 2016-08-17 MED ORDER — ONDANSETRON HCL 4 MG/2ML IJ SOLN
INTRAMUSCULAR | Status: AC
  Filled 2016-08-17: qty 2

## 2016-08-17 MED ORDER — LIDOCAINE-EPINEPHRINE 2 %-1:100000 IJ SOLN
INTRAMUSCULAR | Status: AC
  Filled 2016-08-17: qty 6.8

## 2016-08-17 MED ORDER — EPINEPHRINE PF 1 MG/ML IJ SOLN
INTRAMUSCULAR | Status: DC | PRN
  Administered 2016-08-17: 4 mL

## 2016-08-17 MED ORDER — LACTATED RINGERS IV SOLN: INTRAVENOUS | Status: DC

## 2016-08-17 MED ORDER — FENTANYL CITRATE (PF) 100 MCG/2ML IJ SOLN: 25.0000 ug | INTRAMUSCULAR | Status: DC | PRN

## 2016-08-17 MED ORDER — BACITRACIN ZINC 500 UNIT/GM EX OINT
TOPICAL_OINTMENT | CUTANEOUS | Status: AC
  Filled 2016-08-17: qty 28.35

## 2016-08-17 MED ORDER — SUCCINYLCHOLINE CHLORIDE 20 MG/ML IJ SOLN
INTRAMUSCULAR | Status: DC | PRN
  Administered 2016-08-17: 50 mg via INTRAVENOUS

## 2016-08-17 MED ORDER — SCOPOLAMINE 1 MG/3DAYS TD PT72: 1.0000 | MEDICATED_PATCH | Freq: Once | TRANSDERMAL | Status: DC | PRN

## 2016-08-17 MED ORDER — LIDOCAINE 2% (20 MG/ML) 5 ML SYRINGE
INTRAMUSCULAR | Status: AC
  Filled 2016-08-17: qty 5

## 2016-08-17 MED ORDER — CEFAZOLIN SODIUM-DEXTROSE 2-4 GM/100ML-% IV SOLN
2.0000 g | INTRAVENOUS | Status: AC
  Administered 2016-08-17: 2 g via INTRAVENOUS

## 2016-08-17 MED ORDER — LIDOCAINE HCL (CARDIAC) 20 MG/ML IV SOLN
INTRAVENOUS | Status: DC | PRN
  Administered 2016-08-17: 50 mg via INTRAVENOUS

## 2016-08-17 MED ORDER — PROPOFOL 10 MG/ML IV BOLUS
INTRAVENOUS | Status: DC | PRN
  Administered 2016-08-17: 50 mg via INTRAVENOUS
  Administered 2016-08-17: 40 mg via INTRAVENOUS
  Administered 2016-08-17: 50 mg via INTRAVENOUS
  Administered 2016-08-17: 200 mg via INTRAVENOUS

## 2016-08-17 MED ORDER — SUCCINYLCHOLINE CHLORIDE 200 MG/10ML IV SOSY
PREFILLED_SYRINGE | INTRAVENOUS | Status: AC
  Filled 2016-08-17: qty 10

## 2016-08-17 MED ORDER — MEPERIDINE HCL 25 MG/ML IJ SOLN: 6.2500 mg | INTRAMUSCULAR | Status: DC | PRN

## 2016-08-17 MED ORDER — FENTANYL CITRATE (PF) 100 MCG/2ML IJ SOLN
INTRAMUSCULAR | Status: AC
  Filled 2016-08-17: qty 2

## 2016-08-17 MED ORDER — METOCLOPRAMIDE HCL 5 MG/ML IJ SOLN: 10.0000 mg | Freq: Once | INTRAMUSCULAR | Status: DC | PRN

## 2016-08-17 MED ORDER — PHENYLEPHRINE HCL 10 MG/ML IJ SOLN
INTRAMUSCULAR | Status: DC | PRN
  Administered 2016-08-17: 80 ug via INTRAVENOUS
  Administered 2016-08-17: 40 ug via INTRAVENOUS
  Administered 2016-08-17 (×2): 80 ug via INTRAVENOUS

## 2016-08-17 MED ORDER — FENTANYL CITRATE (PF) 100 MCG/2ML IJ SOLN
INTRAMUSCULAR | Status: DC | PRN
  Administered 2016-08-17: 100 ug via INTRAVENOUS

## 2016-08-17 MED ORDER — DEXAMETHASONE SODIUM PHOSPHATE 10 MG/ML IJ SOLN
INTRAMUSCULAR | Status: AC
  Filled 2016-08-17: qty 1

## 2016-08-17 MED ORDER — MIDAZOLAM HCL 2 MG/2ML IJ SOLN
INTRAMUSCULAR | Status: AC
  Filled 2016-08-17: qty 2

## 2016-08-17 MED ORDER — PHENYLEPHRINE 40 MCG/ML (10ML) SYRINGE FOR IV PUSH (FOR BLOOD PRESSURE SUPPORT)
PREFILLED_SYRINGE | INTRAVENOUS | Status: AC
  Filled 2016-08-17: qty 20

## 2016-08-17 SURGICAL SUPPLY — 66 items
BENZOIN TINCTURE PRP APPL 2/3 (GAUZE/BANDAGES/DRESSINGS) IMPLANT
BLADE SURG 15 STRL LF DISP TIS (BLADE) IMPLANT
BLADE SURG 15 STRL SS (BLADE)
CANISTER SUCT 1200ML W/VALVE (MISCELLANEOUS) ×3 IMPLANT
CLEANER CAUTERY TIP 5X5 PAD (MISCELLANEOUS) IMPLANT
CONT SPEC 4OZ CLIKSEAL STRL BL (MISCELLANEOUS) IMPLANT
CORDS BIPOLAR (ELECTRODE) IMPLANT
COVER BACK TABLE 60X90IN (DRAPES) IMPLANT
COVER MAYO STAND STRL (DRAPES) IMPLANT
DECANTER SPIKE VIAL GLASS SM (MISCELLANEOUS) IMPLANT
DERMABOND ADVANCED (GAUZE/BANDAGES/DRESSINGS)
DERMABOND ADVANCED .7 DNX12 (GAUZE/BANDAGES/DRESSINGS) IMPLANT
DRAPE U-SHAPE 76X120 STRL (DRAPES) IMPLANT
ELECT COATED BLADE 2.86 ST (ELECTRODE) IMPLANT
ELECT REM PT RETURN 9FT ADLT (ELECTROSURGICAL)
ELECTRODE REM PT RTRN 9FT ADLT (ELECTROSURGICAL) IMPLANT
GAUZE SPONGE 4X4 12PLY STRL LF (GAUZE/BANDAGES/DRESSINGS) ×9 IMPLANT
GAUZE SPONGE 4X4 16PLY XRAY LF (GAUZE/BANDAGES/DRESSINGS) IMPLANT
GLOVE SS BIOGEL STRL SZ 7.5 (GLOVE) ×2 IMPLANT
GLOVE SUPERSENSE BIOGEL SZ 7.5 (GLOVE) ×1
GOWN STRL REUS W/ TWL LRG LVL3 (GOWN DISPOSABLE) ×2 IMPLANT
GOWN STRL REUS W/ TWL XL LVL3 (GOWN DISPOSABLE) IMPLANT
GOWN STRL REUS W/TWL LRG LVL3 (GOWN DISPOSABLE) ×1
GOWN STRL REUS W/TWL XL LVL3 (GOWN DISPOSABLE)
GUARD TEETH (MISCELLANEOUS) IMPLANT
HEMOSTAT SURGICEL .5X2 ABSORB (HEMOSTASIS) IMPLANT
MARKER SKIN DUAL TIP RULER LAB (MISCELLANEOUS) IMPLANT
NDL SAFETY ECLIPSE 18X1.5 (NEEDLE) ×2 IMPLANT
NEEDLE HYPO 18GX1.5 SHARP (NEEDLE) ×1
NEEDLE HYPO 25X1 1.5 SAFETY (NEEDLE) IMPLANT
NEEDLE SPNL 22GX7 QUINCKE BK (NEEDLE) IMPLANT
NS IRRIG 1000ML POUR BTL (IV SOLUTION) ×3 IMPLANT
PACK BASIN DAY SURGERY FS (CUSTOM PROCEDURE TRAY) ×3 IMPLANT
PAD CLEANER CAUTERY TIP 5X5 (MISCELLANEOUS)
PATTIES SURGICAL .5 X3 (DISPOSABLE) ×3 IMPLANT
PENCIL BUTTON HOLSTER BLD 10FT (ELECTRODE) IMPLANT
SHEET MEDIUM DRAPE 40X70 STRL (DRAPES) ×3 IMPLANT
SLEEVE SCD COMPRESS KNEE MED (MISCELLANEOUS) ×3 IMPLANT
SOLUTION ANTI FOG 6CC (MISCELLANEOUS) IMPLANT
SOLUTION BUTLER CLEAR DIP (MISCELLANEOUS) ×3 IMPLANT
SPONGE INTESTINAL PEANUT (DISPOSABLE) IMPLANT
STRIP CLOSURE SKIN 1/2X4 (GAUZE/BANDAGES/DRESSINGS) IMPLANT
STRIP CLOSURE SKIN 1/4X4 (GAUZE/BANDAGES/DRESSINGS) IMPLANT
SUCTION FRAZIER HANDLE 10FR (MISCELLANEOUS)
SUCTION TUBE FRAZIER 10FR DISP (MISCELLANEOUS) IMPLANT
SURGILUBE 2OZ TUBE FLIPTOP (MISCELLANEOUS) IMPLANT
SUT CHROMIC 3 0 PS 2 (SUTURE) IMPLANT
SUT CHROMIC 3 0 SH 27 (SUTURE) IMPLANT
SUT ETHILON 4 0 PS 2 18 (SUTURE) IMPLANT
SUT ETHILON 5 0 P 3 18 (SUTURE)
SUT NYLON ETHILON 5-0 P-3 1X18 (SUTURE) IMPLANT
SUT SILK 2 0 TIES 17X18 (SUTURE)
SUT SILK 2-0 18XBRD TIE BLK (SUTURE) IMPLANT
SUT SILK 3 0 SH 30 (SUTURE) IMPLANT
SUT SILK 3 0 TIES 17X18 (SUTURE)
SUT SILK 3-0 18XBRD TIE BLK (SUTURE) IMPLANT
SUT VIC AB 5-0 P-3 18X BRD (SUTURE) IMPLANT
SUT VIC AB 5-0 P3 18 (SUTURE)
SWAB COLLECTION DEVICE MRSA (MISCELLANEOUS) IMPLANT
SWAB CULTURE ESWAB REG 1ML (MISCELLANEOUS) IMPLANT
SYR 5ML LL (SYRINGE) IMPLANT
SYR BULB 3OZ (MISCELLANEOUS) IMPLANT
SYR CONTROL 10ML LL (SYRINGE) ×3 IMPLANT
TOWEL OR 17X24 6PK STRL BLUE (TOWEL DISPOSABLE) ×6 IMPLANT
TRAY DSU PREP LF (CUSTOM PROCEDURE TRAY) IMPLANT
TUBE CONNECTING 20X1/4 (TUBING) ×3 IMPLANT

## 2016-08-17 NOTE — Discharge Instructions (Addendum)
Throat lozenges and sprays for sore throat as needed. Tylenol prn pain Call Dr Lucia Gaskins if you have any problems or questions.   [phone]   Call your surgeon if you experience:   1.  Fever over 101.0. 2.  Inability to urinate. 3.  Nausea and/or vomiting. 4.  Extreme swelling or bruising at the surgical site. 5.  Continued bleeding from the incision. 6.  Increased pain, redness or drainage from the incision. 7.  Problems related to your pain medication. 8.  Any problems and/or concerns   Post Anesthesia Home Care Instructions  Activity: Get plenty of rest for the remainder of the day. A responsible individual must stay with you for 24 hours following the procedure.  For the next 24 hours, DO NOT: -Drive a car -Paediatric nurse -Drink alcoholic beverages -Take any medication unless instructed by your physician -Make any legal decisions or sign important papers.  Meals: Start with liquid foods such as gelatin or soup. Progress to regular foods as tolerated. Avoid greasy, spicy, heavy foods. If nausea and/or vomiting occur, drink only clear liquids until the nausea and/or vomiting subsides. Call your physician if vomiting continues.  Special Instructions/Symptoms: Your throat may feel dry or sore from the anesthesia or the breathing tube placed in your throat during surgery. If this causes discomfort, gargle with warm salt water. The discomfort should disappear within 24 hours.  If you had a scopolamine patch placed behind your ear for the management of post- operative nausea and/or vomiting:  1. The medication in the patch is effective for 72 hours, after which it should be removed.  Wrap patch in a tissue and discard in the trash. Wash hands thoroughly with soap and water. 2. You may remove the patch earlier than 72 hours if you experience unpleasant side effects which may include dry mouth, dizziness or visual disturbances. 3. Avoid touching the patch. Wash your hands with soap  and water after contact with the patch.

## 2016-08-17 NOTE — Anesthesia Preprocedure Evaluation (Signed)
Anesthesia Evaluation  Patient identified by MRN, date of birth, ID band Patient awake    Reviewed: Allergy & Precautions, NPO status , Patient's Chart, lab work & pertinent test results  Airway Mallampati: II  TM Distance: >3 FB Neck ROM: Full    Dental no notable dental hx. (+) Poor Dentition   Pulmonary Current Smoker,    + rhonchi  + decreased breath sounds      Cardiovascular hypertension, Pt. on medications Normal cardiovascular exam Rhythm:Regular Rate:Normal     Neuro/Psych Schizophrenia negative neurological ROS  negative psych ROS   GI/Hepatic negative GI ROS, Neg liver ROS,   Endo/Other  diabetes, Insulin Dependent  Renal/GU CRF and DialysisRenal disease  negative genitourinary   Musculoskeletal negative musculoskeletal ROS (+)   Abdominal   Peds negative pediatric ROS (+)  Hematology negative hematology ROS (+)   Anesthesia Other Findings   Reproductive/Obstetrics negative OB ROS                            Anesthesia Physical Anesthesia Plan  ASA: III  Anesthesia Plan: General   Post-op Pain Management:    Induction: Intravenous  Airway Management Planned: Oral ETT  Additional Equipment:   Intra-op Plan:   Post-operative Plan: Extubation in OR  Informed Consent: I have reviewed the patients History and Physical, chart, labs and discussed the procedure including the risks, benefits and alternatives for the proposed anesthesia with the patient or authorized representative who has indicated his/her understanding and acceptance.   Dental advisory given  Plan Discussed with: CRNA  Anesthesia Plan Comments:         Anesthesia Quick Evaluation

## 2016-08-17 NOTE — Transfer of Care (Signed)
Immediate Anesthesia Transfer of Care Note  Patient: Kevin Deleon  Procedure(s) Performed: Procedure(s): DIRECT LARYNGOSCOPY WITH BIOPSY AND FROZEN SECTION (N/A)  Patient Location: PACU  Anesthesia Type:General  Level of Consciousness: awake and patient cooperative  Airway & Oxygen Therapy: Patient Spontanous Breathing and Patient connected to face mask oxygen  Post-op Assessment: Report given to RN and Post -op Vital signs reviewed and stable  Post vital signs: Reviewed and stable  Last Vitals:  Vitals:   08/17/16 0733 08/17/16 0951  BP: 136/80 137/86  Pulse: 94 (P) 86  Resp: 18 (P) 20  Temp: 36.5 C (P) 36.4 C    Last Pain:  Vitals:   08/17/16 0733  TempSrc: Oral  PainSc: 5       Patients Stated Pain Goal: 3 (41/29/04 7533)  Complications: No apparent anesthesia complications

## 2016-08-17 NOTE — Brief Op Note (Signed)
08/17/2016  9:47 AM  PATIENT:  [name]  46 y.o. male  PRE-OPERATIVE DIAGNOSIS:  DYSPHAGIA, HOARSENESS  POST-OPERATIVE DIAGNOSIS:  DYSPHAGIA, HOARSENESS  PROCEDURE:  Procedure(s): DIRECT LARYNGOSCOPY WITH BIOPSY AND FROZEN SECTION (N/A)  SURGEON:  Surgeon(s) and Role:    * Rozetta Nunnery, MD - Primary  PHYSICIAN ASSISTANT:   ASSISTANTS: none   ANESTHESIA:   general  EBL:  Total I/O In: 400 [I.V.:400] Out: 10 [Blood:10]  BLOOD ADMINISTERED:none  DRAINS: none   LOCAL MEDICATIONS USED:  NONE  SPECIMEN:  Source of Specimen:  laryngeal surface of the epiglottis  DISPOSITION OF SPECIMEN:  PATHOLOGY  COUNTS:  YES  TOURNIQUET:  * No tourniquets in log *  DICTATION: .Other Dictation: Dictation Number [id number]  PLAN OF CARE: Discharge to home after PACU  PATIENT DISPOSITION:  PACU - hemodynamically stable.   Delay start of Pharmacological VTE agent (>24hrs) due to surgical blood loss or risk of bleeding: yes

## 2016-08-17 NOTE — Anesthesia Procedure Notes (Signed)
Procedure Name: Intubation Date/Time: 08/17/2016 9:00 AM Performed by: Marrianne Mood Pre-anesthesia Checklist: Patient identified, Emergency Drugs available, Suction available, Patient being monitored and Timeout performed Patient Re-evaluated:Patient Re-evaluated prior to inductionOxygen Delivery Method: Circle system utilized Preoxygenation: Pre-oxygenation with 100% oxygen Intubation Type: IV induction Ventilation: Mask ventilation without difficulty Laryngoscope Size: Glidescope and 3 Grade View: Grade III Tube type: Oral Tube size: 6.0 mm Number of attempts: 1 Airway Equipment and Method: Stylet and Oral airway Placement Confirmation: ETT inserted through vocal cords under direct vision,  positive ETCO2 and breath sounds checked- equal and bilateral Secured at: 20 cm Tube secured with: Tape Dental Injury: Teeth and Oropharynx as per pre-operative assessment

## 2016-08-17 NOTE — Interval H&P Note (Signed)
History and Physical Interval Note:  08/17/2016 8:38 AM  Kevin Deleon  has presented today for surgery, with the diagnosis of DYSPHAGIA, HOARSENESS  The various methods of treatment have been discussed with the patient and family. After consideration of risks, benefits and other options for treatment, the patient has consented to  Procedure(s): DIRECT LARYNGOSCOPY (N/A) LYMPH NODE BIOPSY (N/A) as a surgical intervention .  The patient's history has been reviewed, patient examined, no change in status, stable for surgery.  I have reviewed the patient's chart and labs.  Questions were answered to the patient's satisfaction.     [name]

## 2016-08-17 NOTE — Op Note (Signed)
NAME:  RENTON, BERKLEY                    ACCOUNT NO.:  MEDICAL RECORD NO.:  [id number]  LOCATION:                                 FACILITY:  PHYSICIAN:  Leonides Sake. 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  PROCEDURE PERFORMED:  Via direct laryngoscopy and biopsy.  SURGEON:  Leonides Sake. Lucia Gaskins, M.D.  ANESTHESIA:  General endotracheal.  COMPLICATIONS:  None.  BRIEF CLINICAL NOTE:  Lochlin is a 46 year old gentleman, who has had a chronic sore throat for the past month.  He has had a negative strep test.  He has been on dialysis for 6 months.  Has a history of schizoaffective disorder and smokes about a pack a day.  On exam in the office, he has an irregular shape of epiglottis with ulcer on the laryngeal surface of the epiglottis.  He was taken to the operating room at this time for direct laryngoscopy and biopsy.  DESCRIPTION OF PROCEDURE:  After adequate endotracheal anesthesia using the glide scope, direct laryngoscopy was performed.  The tongue was soft to palpation as was the base of tongue.  On direct laryngoscopy again of the base of tongue, vallecular actually fairly clear.  However, on visualization the tip of the epiglottis was very thickened and had an ulcer extending up toward the tip of the epiglottis with a very diffusely thickened epiglottis.  The laryngeal surface of the epiglottis was very irregular, friable, bled easily and on gross evaluation was consistent with a carcinoma.  Several biopsies were obtained for frozen section.  Other biopsies were obtained for permanent section.  The gross evidence of the cancer appeared to be confined to the  supraglottic endolarynx.  The piriform sinuses were uninvolved as was the base of tongue, on palpation was relatively soft.  The posterior hypopharyngeal wall was likewise uninvolved.  Photos were obtained of both piriform sinuses, which were clear; however, the tumor did invade into the superficial surface of the right arytenoid.  The vocal cords were clear to evaluation and photos were obtained.  Frozen section revealed findings suspicious for squamous cell carcinoma and procedure was completed.  The patient was awoken from anesthesia, transferred to recovery room postop, doing well.  DISPOSITION:  He is discharged home later this morning on Tylenol and throat lozenges for sore throat.  He is scheduled to get a PET scan on Wednesday.          ______________________________ Leonides Sake. Lucia Gaskins, M.D.     CEN/MEDQ  D:  08/17/2016  T:  08/17/2016  Job:  [id number]  cc:   Dr. London Pepper

## 2016-08-17 NOTE — Anesthesia Postprocedure Evaluation (Signed)
Anesthesia Post Note  Patient: Kevin Deleon  Procedure(s) Performed: Procedure(s) (LRB): DIRECT LARYNGOSCOPY WITH BIOPSY AND FROZEN SECTION (N/A)  Patient location during evaluation: PACU Anesthesia Type: General Level of consciousness: awake and alert Pain management: pain level controlled Vital Signs Assessment: post-procedure vital signs reviewed and stable Respiratory status: spontaneous breathing, nonlabored ventilation, respiratory function stable and patient connected to nasal cannula oxygen Cardiovascular status: blood pressure returned to baseline and stable Postop Assessment: no signs of nausea or vomiting Anesthetic complications: no       Last Vitals:  Vitals:   08/17/16 1015 08/17/16 1033  BP: (!) 151/89 (!) 143/91  Pulse: 95 82  Resp: 19 18  Temp:  36.3 C    Last Pain:  Vitals:   08/17/16 1033  TempSrc:   PainSc: 0-No pain                 Montez Hageman

## 2016-08-18 ENCOUNTER — Encounter: Payer: Self-pay | Admitting: *Deleted

## 2016-08-18 ENCOUNTER — Encounter (HOSPITAL_BASED_OUTPATIENT_CLINIC_OR_DEPARTMENT_OTHER): Payer: Self-pay | Admitting: Otolaryngology

## 2016-08-19 ENCOUNTER — Encounter (HOSPITAL_COMMUNITY)
Admission: RE | Admit: 2016-08-19 | Discharge: 2016-08-19 | Disposition: A | Discharge status: Home / Self Care | Payer: Medicare Other | Source: Ambulatory Visit | Attending: Otolaryngology | Admitting: Otolaryngology

## 2016-08-19 DIAGNOSIS — C321 Malignant neoplasm of supraglottis: Secondary | ICD-10-CM | POA: Diagnosis present

## 2016-08-19 LAB — GLUCOSE, CAPILLARY: GLUCOSE-CAPILLARY: 60 mg/dL — AB (ref 65–99)

## 2016-08-19 MED ORDER — FLUDEOXYGLUCOSE F - 18 (FDG) INJECTION
11.2100 | Freq: Once | INTRAVENOUS | Status: AC | PRN
  Administered 2016-08-19: 11.21 via INTRAVENOUS

## 2016-08-19 NOTE — Progress Notes (Signed)
Oncology Nurse Navigator Documentation  Met with patient's parents [facility] lobby following brief introductory phone call.  They had arrived to learn logistics for tomorrow's PET. I further introduced myself as their son's/their navigator, explained my role as a member of the Care Team. I answered their questions re likely treatment plan for son's cancer, explained RT planning and treatment procedures. I explained purpose of a dental evaluation prior to starting RT, indicated they will be contacted by [facility] to arrange appt within a day or so of appt with Dr. Isidore Moos. Of note, they explained son's:  Renal disease requires dialysis M, W, F 11:00 - 4:00.  I assured them RT appts can be scheduled to accommodate.  Dental condition is extremely poor.  Dentist recommended full extractions 3 yrs ago, the last time he was seen.  Mental health (schizoaffective disorder, bipolar type) presents a challenge as his medication compliance is not consistent.  Housing - lives alone in an apartment. They further shared their son's cancer diagnosis has added another layer of stress to their lives.  I assured them support is available through [facility] Family Support.  I later shared patient/family information with Earl Lagos, LCSW, during patient review meeting. I showed them location of [facility], explained arrival/registration procedures. I provided my contact information, encouraged them to call me with questions/concerns.  Gayleen Orem, RN, BSN, [place] [facility] Nurse [facility] [place] [phone]

## 2016-08-20 ENCOUNTER — Other Ambulatory Visit (HOSPITAL_COMMUNITY): Payer: Self-pay

## 2016-08-20 MED ORDER — TRAZODONE HCL 50 MG PO TABS: ORAL_TABLET | ORAL | 0 refills | Status: DC

## 2016-08-24 ENCOUNTER — Encounter (HOSPITAL_COMMUNITY): Payer: Self-pay | Admitting: Dentistry

## 2016-08-24 ENCOUNTER — Ambulatory Visit (HOSPITAL_COMMUNITY): Payer: Self-pay | Admitting: Dentistry

## 2016-08-24 VITALS — BP 117/76 | HR 75 | Temp 97.7°F

## 2016-08-24 DIAGNOSIS — C321 Malignant neoplasm of supraglottis: Secondary | ICD-10-CM | POA: Diagnosis not present

## 2016-08-24 DIAGNOSIS — R1310 Dysphagia, unspecified: Secondary | ICD-10-CM

## 2016-08-24 DIAGNOSIS — Z01818 Encounter for other preprocedural examination: Secondary | ICD-10-CM

## 2016-08-24 DIAGNOSIS — K0889 Other specified disorders of teeth and supporting structures: Secondary | ICD-10-CM

## 2016-08-24 DIAGNOSIS — K06020 Generalized gingival recession, unspecified: Secondary | ICD-10-CM

## 2016-08-24 DIAGNOSIS — K029 Dental caries, unspecified: Secondary | ICD-10-CM

## 2016-08-24 DIAGNOSIS — K08409 Partial loss of teeth, unspecified cause, unspecified class: Secondary | ICD-10-CM

## 2016-08-24 DIAGNOSIS — M264 Malocclusion, unspecified: Secondary | ICD-10-CM

## 2016-08-24 DIAGNOSIS — K036 Deposits [accretions] on teeth: Secondary | ICD-10-CM

## 2016-08-24 DIAGNOSIS — K083 Retained dental root: Secondary | ICD-10-CM

## 2016-08-24 DIAGNOSIS — K045 Chronic apical periodontitis: Secondary | ICD-10-CM

## 2016-08-24 DIAGNOSIS — K0530 Chronic periodontitis, unspecified: Secondary | ICD-10-CM

## 2016-08-24 DIAGNOSIS — K06010 Localized gingival recession, unspecified: Secondary | ICD-10-CM

## 2016-08-24 DIAGNOSIS — K031 Abrasion of teeth: Secondary | ICD-10-CM

## 2016-08-24 NOTE — Progress Notes (Signed)
DENTAL CONSULTATION  Date of Consultation:  08/24/2016 Patient Name:   Kevin Deleon Date of Birth:   1971/05/18 Medical Record Number: [id number]  VITALS: BP 117/76 (BP Location: Right Arm)   Pulse 75   Temp 97.7 F (36.5 C) (Oral)   CHIEF COMPLAINT: Patient referred by Dr. Isidore Moos for a dental consultation.  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 Patient with anticipated radiation therapy and possible chemotherapy. Patient now seen as part of a medically necessary pre-chemoradiation therapy dental protocol examination.  The patient currently denies acute toothaches, swellings, or abscesses. Patient was last seen for an exam and cleaning in 2015. Patient saw Dr. Eligha Bridegroom at that time.  Patient denies having any partial dentures. Patient denies having dental phobia. Patient did have a history of orthodontic therapy as a teenager. Patient also indicates that he had some trauma to the mandibular anterior teeth with previous splinting of mandibular anterior teeth on the lingual aspect.  PROBLEM LIST: Patient Active Problem List   Diagnosis Date Noted  . Squamous cell cancer of epiglottis (HCC) 08/24/2016    Priority: High  . Schizoaffective disorder, bipolar type ([place]) 02/24/2016  . End stage renal disease ([place]) 07/07/2013  . Chronic kidney disease, stage IV (severe) ([place]) 05/05/2013  . Chronic kidney disease (CKD), stage IV (severe) ([place]) 10/11/2012  . Obesity, unspecified 08/09/2012  . Diabetes mellitus ([place]) 08/09/2012  . HTN (hypertension) 08/09/2012  . Hyperlipidemia 08/09/2012    PMH: Past Medical History:  Diagnosis Date  . Chronic kidney disease    dialysis Tue-Thur-Sat  . Diabetes mellitus ([place])   . GERD (gastroesophageal reflux disease)   . HTN (hypertension)    off BP meds since dialysis started 03-2016  . Hyperlipidemia   . Liver disease   . Mental disorder    Schizo- affective  disorder  . Schizophrenia ([place])    schizo-affective  . Smoker   . Smokers' cough ([place])     PSH: Past Surgical History:  Procedure Laterality Date  . AV FISTULA PLACEMENT Left 05/24/2013   Procedure: ARTERIOVENOUS (AV) FISTULA CREATION BRACHIAL-CEPHALIC;  Surgeon: Conrad Campbellsburg, MD;  Location: [facility];  Service: Vascular;  Laterality: Left;  . DIRECT LARYNGOSCOPY N/A 08/17/2016   Procedure: DIRECT LARYNGOSCOPY WITH BIOPSY AND FROZEN SECTION;  Surgeon: Rozetta Nunnery, MD;  Location: [facility];  Service: ENT;  Laterality: N/A;    ALLERGIES: No Known Allergies  MEDICATIONS: Current Outpatient Prescriptions  Medication Sig Dispense Refill  . b complex-vitamin c-folic acid (NEPHRO-VITE) 0.8 MG TABS tablet Take 1 tablet by mouth daily.    Marland Kitchen glipiZIDE (GLUCOTROL) 5 MG tablet Take 5 mg by mouth daily before breakfast.    . LANTUS SOLOSTAR 100 UNIT/ML injection Inject 28 Units into the skin at bedtime.     . ONE TOUCH ULTRA TEST test strip     . risperiDONE (RISPERDAL) 2 MG tablet Take 1 tablet by mouth at  bedtime 90 tablet 0  . simvastatin (ZOCOR) 20 MG tablet Take 20 mg by mouth daily.    . traZODone (DESYREL) 50 MG tablet Take 1 - 2 tablets at bedtime as needed for sleep 180 tablet 0   No current facility-administered medications for this visit.     LABS: Lab Results  Component Value Date   WBC 15.0 (H) 05/23/2013   HGB 16.7 08/17/2016   HCT 49.0 08/17/2016   MCV 91.4 05/23/2013   PLT 267 05/23/2013  Component Value Date/Time   NA 132 (L) 08/17/2016 0758   K 4.7 08/17/2016 0758   CL 94 (L) 08/17/2016 0758   CO2 23 05/23/2013 1512   GLUCOSE 104 (H) 08/17/2016 0758   BUN 40 (H) 08/17/2016 0758   CREATININE 7.20 (H) 08/17/2016 0758   CALCIUM 9.5 05/23/2013 1512   GFRNONAA 16 (L) 05/23/2013 1512   GFRAA 19 (L) 05/23/2013 1512   No results found for: INR, PROTIME No results found for: PTT  SOCIAL HISTORY: Social History   Social History  . Marital  status: Single    Spouse name: N/A  . Number of children: N/A  . Years of education: N/A   Occupational History  . Not on file.   Social History Main Topics  . Smoking status: Former Smoker    Packs/day: 1.00    Years: 20.00    Types: Cigarettes    Quit date: 08/12/2016  . Smokeless tobacco: Never Used  . Alcohol use No     Comment: occ  . Drug use: No  . Sexual activity: Not on file   Other Topics Concern  . Not on file   Social History Narrative  . No narrative on file    FAMILY HISTORY: Family History  Problem Relation Age of Onset  . Cancer - Lung Other   . Cancer Other     REVIEW OF SYSTEMS: Reviewed With the patient and his mother as per history of present illness. Psych: The patient denies having dental phobia.  DENTAL HISTORY: CHIEF COMPLAINT: Patient referred by Dr. Isidore Moos for a dental consultation.  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 Patient with anticipated radiation therapy and possible chemotherapy. Patient now seen as part of a medically necessary pre-chemoradiation therapy dental protocol examination.  The patient currently denies acute toothaches, swellings, or abscesses. Patient was last seen for an exam and cleaning in 2015. Patient saw Dr. Eligha Bridegroom at that time.  Patient denies having any partial dentures. Patient denies having dental phobia. Patient did have a history of orthodontic therapy as a teenager. Patient also indicates that he had some trauma to the mandibular anterior teeth with previous splinting of mandibular anterior teeth on the lingual aspect.  DENTAL EXAMINATION: GENERAL: The patient is a well-developed, well-nourished male in no acute distress. HEAD AND NECK: There is no palpable neck lymphadenopathy. The patient denies acute TMJ symptoms. The maximum interincisal opening is measured at 43 mm. INTRAORAL EXAM: Patient has normal saliva. There is  no evidence of oral abscess formation. DENTITION: Patient is missing tooth numbers 1, 11, 12, 16, 17, 18, 31, and 32.  There is a retained root segments in the area of #24. PERIODONTAL: The patient has chronic periodontitis with plaque and calculus accumulations, generalized gingival recession, and generalized tooth mobility. There is moderate to severe bone loss noted. DENTAL CARIES/SUBOPTIMAL RESTORATIONS: There are multiple dental caries noted as per dental charting form. Multiple flexure lesions are noted. ENDODONTIC: Patient currently denies acute pulpitis symptoms. There is periapical pathology and radiolucency associated with tooth numbers 9, 19, 24, 25, 26, and 30. CROWN AND BRIDGE: No crown or bridge restorations. PROSTHODONTIC: There are no partial dentures. OCCLUSION: The patient has a poor occlusal scheme and malocclusion secondary to multiple missing teeth, multiple diastemas, supra-eruption and drifting of the unopposed teeth into the edentulous areas, and anterior open bite.  RADIOGRAPHIC INTERPRETATION: An orthopantogram was taken and supplemented with a full series of dental  radiographs. There are multiple missing teeth. There is retained root segments the area of #24. There are multiple areas of periapical pathology and radiolucency associated with tooth numbers 9, 19, 24, 25, 26, and 30. Multiple dental caries are noted. There is moderate to severe bone loss. There is a wire on the lingual aspect of tooth numbers 22, 23, 25, 26, and 27 consistent with splint therapy secondary to previous trauma.  ASSESSMENTS: 1. Squamous cell carcinoma of the epiglottis 2. Pre-chemoradiation therapy dental protocol examination 3. Chronic apical periodontitis 4. Multiple dental caries 5. Multiple flexure lesions 6. Chronic Periodontitis with bone loss 7. Generalized gingival recession 8. Generalized tooth mobility 9. Accretions 10. Missing teeth 11. Retained root segment number 24 12.  Anterior open bite 13. Poor occlusal scheme and malocclusion 14. End-stage renal disease with hemodialysis on Tuesday, Thursday, and Saturday.  PLAN/RECOMMENDATIONS: 1. I discussed the risks, benefits, and complications of various treatment options with the patient in relationship to his medical and dental conditions, anticipated radiation therapy and possible chemotherapy, and chemoradiation therapy side effects to include xerostomia, radiation caries, trismus, mucositis, taste changes, gum and jawbone changes, and risk for infection and osteoradionecrosis. We discussed various treatment options to include no treatment, total and subtotal extractions with alveoloplasty, pre-prosthetic surgery as indicated, periodontal therapy, dental restorations, root canal therapy, crown and bridge therapy, implant therapy, and replacement of missing teeth as indicated. The patient currently wishes to proceed with extraction of remaining teeth with alveoloplasty in the operating room with general anesthesia. The patient will then follow-up with a dentist of his choice for fabrication of upper and lower complete dentures after adequate healing and approximately 3 months after the last radiation therapy has been completed. Impressions were made today for pre-extraction records. The operating room procedure was scheduled for Monday, 08/31/2016 at 7:30 AM at [facility]. Patient should be ready for start of radiation therapy approximately 2 weeks after the date of the extractions.   2. Discussion of findings with medical team and coordination of future medical and dental care as needed.  I spent in excess of  120 minutes during the conduct of this consultation and >50% of this time involved direct face-to-face encounter for counseling and/or coordination of the patient's care.    Lenn Cal, DDS

## 2016-08-24 NOTE — Patient Instructions (Signed)

## 2016-08-25 NOTE — Pre-Procedure Instructions (Signed)
[place]  08/25/2016      [place], [facility] [address] [place] [address] [place] [address] Phone: [phone] Fax: [phone]    Your procedure is scheduled on April 16 at 90 AM.  Report to [place] at 530 AM.  Call this number if you have problems the morning of surgery:  [phone]   Remember:  Do not eat food or drink liquids after midnight.  Take these medicines the morning of surgery with A SIP OF WATER risperidone (risperdal).  Take all other medications as prescribed except 7 days prior to surgery STOP taking any Aspirin, Aleve, Naproxen, Ibuprofen, Motrin, Advil, Goody's, BC's, all herbal medications, fish oil, and all vitamins   Do not wear jewelry, make-up or nail polish.  Do not wear lotions, powders, or perfumes, or deoderant.             Men may shave face and neck.  Do not bring valuables to the hospital.  [facility] is not responsible for any belongings or valuables.  Contacts, dentures or bridgework may not be worn into surgery.  Leave your suitcase in the car.  After surgery it may be brought to your room.  For patients admitted to the hospital, discharge time will be determined by your treatment team.  Patients discharged the day of surgery will not be allowed to drive home.      How to Manage Your Diabetes Before and After Surgery  Why is it important to control my blood sugar before and after surgery? . Improving blood sugar levels before and after surgery helps healing and can limit problems. . A way of improving blood sugar control is eating a healthy diet by: o  Eating less sugar and carbohydrates o  Increasing activity/exercise o  Talking with your doctor about reaching your blood sugar goals . High blood sugars (greater than 180 mg/dL) can raise your risk of infections and slow your recovery, so you will need to focus on controlling your diabetes during the  weeks before surgery. . Make sure that the doctor who takes care of your diabetes knows about your planned surgery including the date and location.  How do I manage my blood sugar before surgery? . Check your blood sugar at least 4 times a day, starting 2 days before surgery, to make sure that the level is not too high or low. o Check your blood sugar the morning of your surgery when you wake up and every 2 hours until you get to the Short Stay unit. . If your blood sugar is less than 70 mg/dL, you will need to treat for low blood sugar: o Do not take insulin. o Treat a low blood sugar (less than 70 mg/dL) with  cup of clear juice (cranberry or apple), 4 glucose tablets, OR glucose gel. o Recheck blood sugar in 15 minutes after treatment (to make sure it is greater than 70 mg/dL). If your blood sugar is not greater than 70 mg/dL on recheck, call [phone] for further instructions. . Report your blood sugar to the short stay nurse when you get to Short Stay.  . If you are admitted to the hospital after surgery: o Your blood sugar will be checked by the staff and you will probably be given insulin after surgery (instead of oral diabetes medicines) to make sure you have good blood sugar levels. o The goal for blood sugar control  after surgery is 80-180 mg/dL.      WHAT DO I DO ABOUT MY DIABETES MEDICATION?   Marland Kitchen Do not take oral diabetes medicines (pills) the morning of surgery.  . THE NIGHT BEFORE SURGERY, take 14 units of lantus insulin.         Reviewed and Endorsed by [facility] Patient Education Committee, August 2015  Special instructions:   [facility]- Preparing For Surgery  Before surgery, you can play an important role. Because skin is not sterile, your skin needs to be as free of germs as possible. You can reduce the number of germs on your skin by washing with CHG (chlorahexidine gluconate) Soap before surgery.  CHG is an antiseptic cleaner which kills germs and bonds  with the skin to continue killing germs even after washing.  Please do not use if you have an allergy to CHG or antibacterial soaps. If your skin becomes reddened/irritated stop using the CHG.  Do not shave (including legs and underarms) for at least 48 hours prior to first CHG shower. It is OK to shave your face.  Please follow these instructions carefully.   1. Shower the NIGHT BEFORE SURGERY and the MORNING OF SURGERY with CHG.   2. If you chose to wash your hair, wash your hair first as usual with your normal shampoo.  3. After you shampoo, rinse your hair and body thoroughly to remove the shampoo.  4. Use CHG as you would any other liquid soap. You can apply CHG directly to the skin and wash gently with a scrungie or a clean washcloth.   5. Apply the CHG Soap to your body ONLY FROM THE NECK DOWN.  Do not use on open wounds or open sores. Avoid contact with your eyes, ears, mouth and genitals (private parts). Wash genitals (private parts) with your normal soap.  6. Wash thoroughly, paying special attention to the area where your surgery will be performed.  7. Thoroughly rinse your body with warm water from the neck down.  8. DO NOT shower/wash with your normal soap after using and rinsing off the CHG Soap.  9. Pat yourself dry with a CLEAN TOWEL.   10. Wear CLEAN PAJAMAS   11. Place CLEAN SHEETS on your bed the night of your first shower and DO NOT SLEEP WITH PETS.    Day of Surgery: Do not apply any deodorants/lotions. Please wear clean clothes to the hospital/surgery center.     Please read over the following fact sheets that you were given. Pain Booklet and Surgical Site Infection Prevention

## 2016-08-26 ENCOUNTER — Encounter (HOSPITAL_COMMUNITY)
Admission: RE | Admit: 2016-08-26 | Discharge: 2016-08-26 | Disposition: A | Discharge status: Home / Self Care | Payer: Medicare Other | Source: Ambulatory Visit | Attending: Dentistry | Admitting: Dentistry

## 2016-08-26 ENCOUNTER — Encounter (HOSPITAL_COMMUNITY): Payer: Self-pay

## 2016-08-26 DIAGNOSIS — K029 Dental caries, unspecified: Secondary | ICD-10-CM | POA: Insufficient documentation

## 2016-08-26 DIAGNOSIS — Z01818 Encounter for other preprocedural examination: Secondary | ICD-10-CM | POA: Diagnosis not present

## 2016-08-26 DIAGNOSIS — E119 Type 2 diabetes mellitus without complications: Secondary | ICD-10-CM | POA: Insufficient documentation

## 2016-08-26 DIAGNOSIS — C321 Malignant neoplasm of supraglottis: Secondary | ICD-10-CM | POA: Diagnosis not present

## 2016-08-26 HISTORY — DX: Dependence on renal dialysis: Z99.2

## 2016-08-26 LAB — COMPREHENSIVE METABOLIC PANEL
ALT: 9 U/L — AB (ref 17–63)
ANION GAP: 14 (ref 5–15)
AST: 15 U/L (ref 15–41)
Albumin: 3.7 g/dL (ref 3.5–5.0)
Alkaline Phosphatase: 88 U/L (ref 38–126)
BUN: 25 mg/dL — ABNORMAL HIGH (ref 6–20)
CALCIUM: 9.6 mg/dL (ref 8.9–10.3)
CHLORIDE: 95 mmol/L — AB (ref 101–111)
CO2: 28 mmol/L (ref 22–32)
CREATININE: 5.51 mg/dL — AB (ref 0.61–1.24)
GFR, EST AFRICAN AMERICAN: 13 mL/min — AB (ref >60)
GFR, EST NON AFRICAN AMERICAN: 11 mL/min — AB (ref >60)
Glucose, Bld: 91 mg/dL (ref 65–99)
Potassium: 4.6 mmol/L (ref 3.5–5.1)
SODIUM: 137 mmol/L (ref 135–145)
Total Bilirubin: 0.4 mg/dL (ref 0.3–1.2)
Total Protein: 7.1 g/dL (ref 6.5–8.1)

## 2016-08-26 LAB — CBC
HCT: 44.6 % (ref 39.0–52.0)
HEMOGLOBIN: 14.5 g/dL (ref 13.0–17.0)
MCH: 31.8 pg (ref 26.0–34.0)
MCHC: 32.5 g/dL (ref 30.0–36.0)
MCV: 97.8 fL (ref 78.0–100.0)
Platelets: 244 10*3/uL (ref 150–400)
RBC: 4.56 MIL/uL (ref 4.22–5.81)
RDW: 16.5 % — ABNORMAL HIGH (ref 11.5–15.5)
WBC: 10.9 10*3/uL — AB (ref 4.0–10.5)

## 2016-08-26 LAB — GLUCOSE, CAPILLARY: GLUCOSE-CAPILLARY: 90 mg/dL (ref 65–99)

## 2016-08-26 NOTE — Progress Notes (Signed)
Anesthesia PAT Evaluation: Patient is a 46 year old male scheduled for multiple teeth extraction with alveoloplasty on 08/31/16 by Dr. Enrique Sack. Procedure indicated for pre-chemoradiation therapy for epiglottic cancer.  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 BMI is consistent with obesity.  PCP is listed as Dr. London Pepper.  Nephrologist is Dr. Jeneen Rinks Deterding. RAD-ONC is Dr. Eppie Gibson. ENT is Dr. Melony Overly. Psychiatrist is Dr. Berniece Andreas.  Meds include glipizide, Lantus, NicoDerm CQ, Risperdal, Zocor, trazodone.  BP (!) (P) 121/98   Temp (P) 36.4 C   Resp (P) 20   Ht (P) 5\' 11"  (1.803 m)   Wt (P) 229 lb 6.4 oz (104.1 kg)   SpO2 (P) 96%   BMI (P) 31.99 kg/m  Patient presents with his mother who will also be with him on the day of surgery. He has a flat affect. He is able to answer questions, but with brief responses. He denied any cardiac issues. Reports diabetes control is good. No SOB. No history of bleeding disorders, epistaxis, nasal or skull fractures, chronic sinusitis. Heart RRR, but distant. I did not note a murmur. Lungs clear. Nares are narrow.    EKG 08/17/16: NSR.  Preoperative labs noted. He will need an STAT4 on the day of surgery (ESRD). A1c in process.   I discussed with patient and mother that case was posted for general anesthesia and may require nasal tube. His nares appear narrow, but otherwise did not report any contraindications for nasal tube. Anesthesiologist to evaluate on the day of surgery to determine the definitive anesthesia plan. His mother requested I let the dialysis center know about surgery plans in case adjustments would be needed in  post-dialysis heparin doses or increased pain post-procedure. I notified nurse Renee who will let the nephrologist know.   George Hugh [facility] Short Stay Center/Anesthesiology Phone [phone] 08/26/2016 11:35 AM

## 2016-08-26 NOTE — Progress Notes (Signed)
Head and Neck Cancer Location of Tumor / Histology: Epiglottis diagnosed 08/17/2016 FINAL DIAGNOSIS ecimen Gross and Clinical Information Patient presented 1 months ago with symptoms of: chronic sore throat  Biopsies of epiglottis revealed:  08/17/2016 Diagnosis 1. Epiglottis, biopsy - SQUAMOUS CELL CARCINOMA. 2. Epiglottis, biopsy - SQUAMOUS CELL CARCINOMA. Microscopic Comment 1. , 2. Immunohistochemistry for p16 (HPV) will be performed and reported as an addendum. Dr Tresa Moore has reviewed this case and agrees. (JDP:ecj 08/18/2016) ADDENDUM: Immunohistochemistry for p16 (HPV) shows strong positivity. (JDP:ah 08/19/16) Claudette Laws MD   Nutrition Status Yes No Comments  Weight changes? []  [x]  He denies  Swallowing concerns? []  [x]  He deneis  PEG? []  [x]     Referrals Yes No Comments  Social Work? [x]  []  Will be here for consult today  Dentistry? [x]  []  Dr. Enrique Sack seen 08/24/16. Plan for multiple tooth extractions with alveoloplasty on 08/31/16  Swallowing therapy? []  [x]    Nutrition? []  [x]    Med/Onc? []  [x]  Not a Chemotherapy candidate   Safety Issues Yes No Comments  Prior radiation? []  [x]    Pacemaker/ICD? []  [x]    Possible current pregnancy? []  [x]    Is the patient on methotrexate? []  [x]     Tobacco/Marijuana/Snuff/ETOH use: He reports that he quit smoking 2 weeks ago. He is using a nicotine patch and nicotine gum. He smoked one pack daily for about 20 years. He denies smokeless tobacco use, drug use, or alcohol use.      Past/Anticipated interventions by otolaryngology, if any:  08/12/2016 Dr. Melony Overly Assessment/Plan: [place], [place] for Procedure(s): DIRECT LARYNGOSCOPY and biopsy  08/17/2016 Direct Laryngoscopy and biopsy  Past/Anticipated interventions by medical oncology, if any: none   Current Complaints / other details:   Past Medical History: HTN DM CKD Stage IV: left brachiocephalic arteriovenous fistula Hemodialysis 3 times a  week, need to confirm days of week receives dialysis Schizophrenia disorder/biopolar  Mother Avel Peace on file [place] scanned 12/19/2012 & on Advanced Direct[place] scanned on 07/09/2012   Mother states patient is noncompliant with po medications.  Patient lives alone.  Conflicting notes on what dates he goes to Hemodialysis and they are wanting to make sure RadOnc treatments will not conflict with HD treatments.    BP 107/68   Pulse 93   Temp 97.9 F (36.6 C)   Ht 5\' 11"  (1.803 m)   Wt 226 lb 12.8 oz (102.9 kg)   SpO2 100% Comment: room air  BMI 31.63 kg/m    Wt Readings from Last 3 Encounters:  08/28/16 226 lb 12.8 oz (102.9 kg)  08/26/16 229 lb 6.4 oz (104.1 kg)  08/17/16 226 lb 6.4 oz (102.7 kg)

## 2016-08-27 ENCOUNTER — Telehealth: Payer: Self-pay | Admitting: *Deleted

## 2016-08-27 LAB — HEMOGLOBIN A1C
HEMOGLOBIN A1C: 7.1 % — AB (ref 4.8–5.6)
MEAN PLASMA GLUCOSE: 157 mg/dL

## 2016-08-27 NOTE — Telephone Encounter (Signed)
Oncology Nurse Navigator Documentation  Received email from patient's step-mother.  She shared additional information in advance of tomorrow morning's appt with Dr. Isidore Moos re his mental/medical hx:   "In advance of our meeting tomorrow, I would like to give you some background. As you can imagine, we are struggling with this.  Let's start with Clay's mental illness (schizoaffective disorder).  He started having problems at age 46.  He has been hospitalized 5 times over the years.    All due to noncompliance with antipsychotic meds.  His kidney failure is due, in part, to taking lithium almost 20 years and not getting blood tests regularly.  His last hospitalization was 6 years ago.  I decided I needed to get guardianship.  He still is somewhat lax in taking all his meds.  His memory and comprehension are selective and inconsistent."  She further indicated high anxiety on her and her husband's part.    Email forwarded to Dr. Isidore Moos and Polo Riley, LCSW.  Lauren later indicated she will join [place] during appt.  Gayleen Orem, RN, BSN, [place] [facility] Oncology Nurse [place] at [place] [phone]

## 2016-08-28 ENCOUNTER — Ambulatory Visit
Admission: RE | Admit: 2016-08-28 | Discharge: 2016-08-28 | Disposition: A | Discharge status: Home / Self Care | Payer: Medicare Other | Source: Ambulatory Visit | Attending: Radiation Oncology | Admitting: Radiation Oncology

## 2016-08-28 ENCOUNTER — Encounter: Payer: Self-pay | Admitting: *Deleted

## 2016-08-28 ENCOUNTER — Encounter: Payer: Self-pay | Admitting: Radiation Oncology

## 2016-08-28 VITALS — BP 107/68 | HR 93 | Temp 97.9°F | Ht 71.0 in | Wt 226.8 lb

## 2016-08-28 DIAGNOSIS — E1122 Type 2 diabetes mellitus with diabetic chronic kidney disease: Secondary | ICD-10-CM | POA: Insufficient documentation

## 2016-08-28 DIAGNOSIS — R918 Other nonspecific abnormal finding of lung field: Secondary | ICD-10-CM | POA: Diagnosis not present

## 2016-08-28 DIAGNOSIS — N189 Chronic kidney disease, unspecified: Secondary | ICD-10-CM | POA: Insufficient documentation

## 2016-08-28 DIAGNOSIS — C321 Malignant neoplasm of supraglottis: Secondary | ICD-10-CM

## 2016-08-28 DIAGNOSIS — Z794 Long term (current) use of insulin: Secondary | ICD-10-CM | POA: Insufficient documentation

## 2016-08-28 DIAGNOSIS — Z87891 Personal history of nicotine dependence: Secondary | ICD-10-CM | POA: Diagnosis not present

## 2016-08-28 DIAGNOSIS — E785 Hyperlipidemia, unspecified: Secondary | ICD-10-CM | POA: Insufficient documentation

## 2016-08-28 DIAGNOSIS — Z1329 Encounter for screening for other suspected endocrine disorder: Secondary | ICD-10-CM

## 2016-08-28 DIAGNOSIS — F259 Schizoaffective disorder, unspecified: Secondary | ICD-10-CM | POA: Insufficient documentation

## 2016-08-28 DIAGNOSIS — K769 Liver disease, unspecified: Secondary | ICD-10-CM | POA: Insufficient documentation

## 2016-08-28 DIAGNOSIS — I129 Hypertensive chronic kidney disease with stage 1 through stage 4 chronic kidney disease, or unspecified chronic kidney disease: Secondary | ICD-10-CM | POA: Diagnosis not present

## 2016-08-28 DIAGNOSIS — Z79899 Other long term (current) drug therapy: Secondary | ICD-10-CM | POA: Insufficient documentation

## 2016-08-28 DIAGNOSIS — K219 Gastro-esophageal reflux disease without esophagitis: Secondary | ICD-10-CM | POA: Diagnosis not present

## 2016-08-28 DIAGNOSIS — Z510 Encounter for antineoplastic radiation therapy: Secondary | ICD-10-CM | POA: Diagnosis not present

## 2016-08-28 DIAGNOSIS — Z992 Dependence on renal dialysis: Secondary | ICD-10-CM | POA: Insufficient documentation

## 2016-08-28 NOTE — Progress Notes (Addendum)
Radiation Oncology         [phone] ________________________________  Initial outpatient Consultation  Name: Kevin Deleon MRN: [id number]  Date: 08/28/2016  DOB: Mar 20, 1971  OQ:HUTMLY, Marjory Lies, MD  London Pepper, MD  Melony Overly MD,  Manon Hilding MD,  Berniece Andreas MD   REFERRING PHYSICIAN: London Pepper, MD   DIAGNOSIS:    ICD-9-CM ICD-10-CM   1. 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 male who presented with dysphagia and sore throat for 1 month.  Patient was discussed at tumor board this week. He had a CT scan on 08/14/16 which showed a circumferential mass at the inferior oropharynx/supraglottis extending over 5 cm. There was epiglottic involvement. Lymph nodes were normal. PET scan on 08/19/16 was also reviewed at tumor board. This showed a primary epiglottic lesion. It appeared that the entire epiglottis was involved +/- AE folds. There were no hypermetabolic lymph nodes. There were 2 solid subcentimeter pulmonary nodules that were indeterminate.   Biopsy of epiglottis on 08/17/16 revealed squamous cell carcinoma with patchy p16 positivity. I read Dr Pollie Friar op note.  The patient has seen dentistry with reccomendations for full extractions on 08/31/16. Patient's family member contacted our patient navigator prior to consult today. She expressed significant anxiety on the part of the patient complicated by his mental illness. She expressed concerns about his ability to follow through with all recommendations, including dental extractions.  The patient has a history of limited compliance with his antipsychotic medications for chronic schizoaffective disorder.  According to family his memory and comprehension are selective and inconsistent. His kidney failure is due in part to taking lithium for almost 20 years and not getting blood tests regularly.  The patient reports to the clinic today to discuss the role that radiation therapy may play in the treatment of his disease. He is accompanied by his parents. We are joined today by Gayleen Orem, RN, Head and Neck Nurse Navigator and Polo Riley, LCSW.   On review of systems, the patient denies recent weight changes. He denies swallowing concerns at this time. The patient does not have a PEG tube in place. The patient stopped smoking 2 weeks ago. He is currently using a nicotine patch and gum. Prior to smoking cessation he smoked one pack daily for approximately 20 years. He denies smokeless tobacco, drug or alcohol use.   PREVIOUS RADIATION THERAPY: No  PAST MEDICAL HISTORY:  has a past medical history of Chronic kidney disease; Diabetes mellitus ([place]); Dialysis patient [facility]); GERD (gastroesophageal reflux disease); HTN (hypertension); Hyperlipidemia; Liver disease; Mental disorder; Schizophrenia ([place]); Smoker; and Smokers' cough ([place]).    PAST SURGICAL HISTORY: Past Surgical History:  Procedure Laterality Date  . AV FISTULA PLACEMENT Left 05/24/2013   Procedure: ARTERIOVENOUS (AV) FISTULA CREATION BRACHIAL-CEPHALIC;  Surgeon: Conrad Kendall Park, MD;  Location: [place];  Service: Vascular;  Laterality: Left;  . DIRECT LARYNGOSCOPY N/A 08/17/2016   Procedure: DIRECT LARYNGOSCOPY WITH BIOPSY AND FROZEN SECTION;  Surgeon: Rozetta Nunnery, MD;  Location: [place];  Service: ENT;  Laterality: N/A;    FAMILY HISTORY: family history includes Cancer in his other; Cancer - Lung in his other.  SOCIAL HISTORY:  reports that he quit smoking about 2 weeks ago. His smoking use included Cigarettes. He has a 20.00 pack-year smoking history. He has never used smokeless tobacco. He reports that he does not drink  alcohol or use drugs. The patient lives alone in [place]. He likes to watch tv and movies. His parents live in [place]. The patient quit smoking in April 2018.  ALLERGIES: Patient has no known allergies.  MEDICATIONS:  Current Outpatient Prescriptions  Medication Sig Dispense Refill  . glipiZIDE (GLUCOTROL) 5 MG tablet Take 5 mg by mouth daily at 12 noon.     Marland Kitchen LANTUS SOLOSTAR 100 UNIT/ML injection Inject 28 Units into the skin at bedtime.     . nicotine (NICODERM CQ - DOSED IN MG/24 HOURS) 21 mg/24hr patch Place 21 mg onto the skin daily.    . ONE TOUCH ULTRA TEST test strip 1 each by Other route every evening.     . risperiDONE (RISPERDAL) 2 MG tablet Take 1 tablet by mouth at  bedtime (Patient taking differently: Take 2 mg by mouth daily at 12 noon. ) 90 tablet 0  . simvastatin (ZOCOR) 20 MG tablet Take 20 mg by mouth daily at 12 noon.     . traZODone (DESYREL) 50 MG tablet Take 1 - 2 tablets at bedtime as needed for sleep 180 tablet 0  . multivitamin (RENA-VIT) TABS tablet Take 1 tablet by mouth daily.     No current facility-administered medications for this encounter.     REVIEW OF SYSTEMS:  Notable for that above.   PHYSICAL EXAM:  height is 5\' 11"  (1.803 m) and weight is 226 lb 12.8 oz (102.9 kg). His temperature is 97.9 F (36.6 C). His blood pressure is 107/68 and his pulse is 93. His oxygen saturation is 100%.   General: Alert and oriented, in no acute distress. HEENT: Extraocular movements are intact. Mucous membranes are moist. He is missing some of his teeth; he has some metalwork in his remaining teeth. He has a large uvula, and the right tonsil slightly larger than the left. No sign of tumor in the oral cavity or upper oropharynx. Neck: No palpable masses in the cervical or supraclavicular regions. Heart: Regular in rate and rhythm with no murmurs. Chest: Clear to auscultation bilaterally. Abdomen: Slightly distended but without tenderness or rigidity. Extremities:  No edema in wrists or ankles. In the left antecubital region the patient has an intravenous fistula used for hemodialysis. Lymphatics: see Neck Exam Skin: No concerning lesions. Musculoskeletal: symmetric strength and muscle tone throughout. Neurologic: No obvious focalities. Speech is fluent. Coordination is intact. EOMI. Psychiatric: Judgment and insight are intact. Affect is appropriate. Patient is alert and oriented x 4.  Laryngoscopy was deferred today per the patient.   ECOG = 2   LABORATORY DATA:  Lab Results  Component Value Date   WBC 10.9 (H) 08/26/2016   HGB 14.5 08/26/2016   HCT 44.6 08/26/2016   MCV 97.8 08/26/2016   PLT 244 08/26/2016   CMP     Component Value Date/Time   NA 137 08/26/2016 0937   K 4.6 08/26/2016 0937   CL 95 (L) 08/26/2016 0937   CO2 28 08/26/2016 0937   GLUCOSE 91 08/26/2016 0937   BUN 25 (H) 08/26/2016 0937   CREATININE 5.51 (H) 08/26/2016 0937   CALCIUM 9.6 08/26/2016 0937   PROT 7.1 08/26/2016 0937   ALBUMIN 3.7 08/26/2016 0937   AST 15 08/26/2016 0937   ALT 9 (L) 08/26/2016 0865  ALKPHOS 88 08/26/2016 0937   BILITOT 0.4 08/26/2016 0937   GFRNONAA 11 (L) 08/26/2016 0937   GFRAA 13 (L) 08/26/2016 0937     No results found for: TSH     RADIOGRAPHY: Ct Soft Tissue Neck W Contrast  Result Date: 08/14/2016 CLINICAL DATA:  Dysphagia. Hoarseness. Sore throat. Duration of symptoms 1 month. EXAM: CT NECK WITH CONTRAST TECHNIQUE: Multidetector CT imaging of the neck was performed using the standard protocol following the bolus administration of intravenous contrast. CONTRAST:  75mL ISOVUE-300 IOPAMIDOL (ISOVUE-300) INJECTION 61% COMPARISON:  None. FINDINGS: Pharynx and larynx: Extensive inferior oropharyngeal/supraglottic mass with circumferential extension extending over a length of almost 5 cm including involvement of the epiglottis. No glottic or subglottic mass. Salivary glands: Parotid and submandibular glands are normal. Thyroid: Normal  Lymph nodes: No enlarged or low-density nodes on either side of the neck. Vascular: Carotid bifurcation atherosclerosis. Limited intracranial: Normal Visualized orbits: Normal Mastoids and visualized paranasal sinuses: Clear Skeleton: Normal Upper chest: Normal Other: None IMPRESSION: Circumferential mass lesion at the inferior oropharynx/supraglottic region extending over a length of almost 5 cm. Epiglottic involvement. 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 Lymph nodes are within normal limits in size and density. Electronically Signed   By: Nelson Chimes M.D.   On: 08/14/2016 11:46   Nm Pet Image Initial (pi) Skull Base To Thigh  Result Date: 08/19/2016 CLINICAL DATA:  Initial treatment strategy for squamous cell carcinoma of the epiglottis diagnosed on 08/17/2016 biopsy. EXAM: NUCLEAR MEDICINE PET SKULL BASE TO THIGH TECHNIQUE: 11.2 mCi F-18 FDG was injected intravenously. Full-ring PET imaging was performed from the skull base to thigh after the radiotracer. CT data was obtained and used for attenuation correction and anatomic localization. FASTING BLOOD GLUCOSE:  Value: 60 mg/dl COMPARISON:  08/14/2016 CT neck. FINDINGS: NECK Intense hypermetabolism associated with the irregular thickening of the entire epiglottis with max SUV 23.4. No enlarged or hypermetabolic lymph nodes in the neck, including no enlarged or hypermetabolic lateral retropharyngeal nodes. CHEST Left anterior descending coronary atherosclerosis. Atherosclerotic nonaneurysmal thoracic aorta. No enlarged or hypermetabolic axillary, mediastinal or hilar lymph nodes. No pneumothorax. No pleural effusions. Right upper lobe 4 mm solid pulmonary nodule (series 8/ image 29) and anterior left lower lobe 5 mm pulmonary nodule (series 8/image 30), below PET resolution, not associated with significant metabolism. No acute consolidative airspace disease or additional significant pulmonary nodules. ABDOMEN/PELVIS Nonspecific hypermetabolism in the  first portion of the duodenum, with no discrete mass or wall thickening in this location on the CT images. Left adrenal 2.1 cm adenoma with density -7 HU, with metabolism below that of the liver. No additional discrete adrenal nodules. Nonspecific hypermetabolism at the anorectal junction with max SUV 8.3, with no discrete mass or definite wall thickening in this location on the CT images. No abnormal hypermetabolic activity within the liver, pancreas, or spleen. No hypermetabolic lymph nodes in the abdomen or pelvis. Symmetric renal atrophy. No hydronephrosis. Mild diffuse colonic diverticulosis. Atherosclerotic nonaneurysmal abdominal aorta. SKELETON No focal hypermetabolic activity to suggest skeletal metastasis. IMPRESSION: 1. Intense hypermetabolism associated with irregular thickening of the entire epiglottis, compatible with known epiglottic malignancy. 2. No hypermetabolic locoregional or distant metastatic disease. 3. Two solid pulmonary nodules, largest 5 mm, below PET resolution, indeterminate. Recommend attention on initial chest CT follow-up in 3-6 months. 4. Nonspecific hypermetabolism in the first portion of the duodenum without definite CT correlate, cannot exclude peptic ulcer disease or other etiologies. Further evaluation such as upper endoscopy can be performed as clinically  warranted. 5. Nonspecific hypermetabolism at the anorectal junction without definite CT correlate, for which correlation with clinical exam and history is advised. 6. Additional findings include aortic atherosclerosis, 1 vessel coronary atherosclerosis, diffuse mild colonic diverticulosis and left adrenal adenoma. Electronically Signed   By: Ilona Sorrel M.D.   On: 08/19/2016 10:14      IMPRESSION/PLAN: This is a delightful patient with epiglottic head and neck cancer. I would recommend radiotherapy for this patient. The patient is a good candidate for 7 weeks of radiotherapy. Chemotherapy is not recommended per tumor  board discussion w/ med/onc.  We discussed the potential risks, benefits, and side effects of radiotherapy. We talked in detail about acute and late effects. We discussed that some of the most bothersome acute effects may be mucositis, dysgeusia, salivary changes, skin irritation, hair loss, dehydration, weight loss and fatigue. We talked about late effects which include but are not necessarily limited to dysphagia, hypothyroidism, nerve injury, spinal cord injury, xerostomia, trismus, and neck edema. No guarantees of treatment were given. A consent form was signed and placed in the patient's medical record. The patient is enthusiastic about proceeding with treatment. I look forward to participating in the patient's care.    Simulation (treatment planning) will take place following clearance by Dr. Enrique Sack.  We also discussed that the treatment of head and neck cancer is a multidisciplinary process to maximize treatment outcomes and quality of life. For this reasons the following referrals have been or will be made:  1) Nutritionist for nutrition support during and after treatment.  2) Speech language pathology for swallowing and/or speech therapy. I will order a swallowing study to assess for aspiration prior to meeting with the swallowing therapist.  3) Social work for social support.   4) Physical therapy due to risk of lymphedema in neck and deconditioning.  5) Baseline labs including TSH.  Patient declined laryngoscopy today despite my recommendation.  I have reviewed this patient with Dr. Alvy Bimler. I discussed with the patient and his family that the patient is likely not a good candidate for chemotherapy.  Full dental extractions have been recommended for 4/16. The patient is ammenable to this. He is agreeable with all plans of care. PEG tube will be placed only if needed - per today's discussion.  The patient sees Dr. Barnabas Lister Deterding in Nephrology. His PCP is Dr. London Pepper.  Psychiatrist - Dr Berniece Andreas. I will copy them both on my note to review the patient's plan for treatment, as well as the patient's contraindications.    __________________________________________   Eppie Gibson, MD  This document serves as a record of services personally performed by Eppie Gibson, MD. It was created on her behalf by Maryla Morrow, a trained medical scribe. The creation of this record is based on the scribe's personal observations and the provider's statements to them. This document has been checked and approved by the attending provider.

## 2016-08-30 NOTE — Anesthesia Preprocedure Evaluation (Signed)
Anesthesia Evaluation  Patient identified by MRN, date of birth, ID band Patient awake    Reviewed: Allergy & Precautions, NPO status , Patient's Chart, lab work & pertinent test results  Airway Mallampati: II  TM Distance: >3 FB Neck ROM: Full    Dental no notable dental hx. (+) Poor Dentition   Pulmonary Current Smoker, former smoker,    + rhonchi  + decreased breath sounds      Cardiovascular hypertension, Pt. on medications Normal cardiovascular exam Rhythm:Regular Rate:Normal     Neuro/Psych Schizophrenia negative neurological ROS  negative psych ROS   GI/Hepatic negative GI ROS, Neg liver ROS,   Endo/Other  diabetes, Insulin Dependent  Renal/GU CRF and DialysisRenal disease  negative genitourinary   Musculoskeletal negative musculoskeletal ROS (+)   Abdominal   Peds negative pediatric ROS (+)  Hematology negative hematology ROS (+)   Anesthesia Other Findings   Reproductive/Obstetrics negative OB ROS                             Anesthesia Physical  Anesthesia Plan  ASA: III  Anesthesia Plan: General   Post-op Pain Management:    Induction: Intravenous  Airway Management Planned: Nasal ETT and Video Laryngoscope Planned  Additional Equipment:   Intra-op Plan:   Post-operative Plan: Extubation in OR  Informed Consent: I have reviewed the patients History and Physical, chart, labs and discussed the procedure including the risks, benefits and alternatives for the proposed anesthesia with the patient or authorized representative who has indicated his/her understanding and acceptance.   Dental advisory given  Plan Discussed with: CRNA  Anesthesia Plan Comments:         Anesthesia Quick Evaluation

## 2016-08-31 ENCOUNTER — Encounter (HOSPITAL_COMMUNITY): Payer: Self-pay | Admitting: *Deleted

## 2016-08-31 ENCOUNTER — Ambulatory Visit (HOSPITAL_COMMUNITY)
Admission: RE | Admit: 2016-08-31 | Discharge: 2016-08-31 | Disposition: A | Discharge status: Home / Self Care | Payer: Medicare Other | Source: Ambulatory Visit | Attending: Dentistry | Admitting: Dentistry

## 2016-08-31 ENCOUNTER — Ambulatory Visit (HOSPITAL_COMMUNITY): Payer: Medicare Other | Admitting: Vascular Surgery

## 2016-08-31 ENCOUNTER — Encounter (HOSPITAL_COMMUNITY)
Admission: RE | Disposition: A | Discharge status: Home / Self Care | Payer: Self-pay | Source: Ambulatory Visit | Attending: Dentistry

## 2016-08-31 ENCOUNTER — Ambulatory Visit (HOSPITAL_COMMUNITY): Payer: Medicare Other | Admitting: Anesthesiology

## 2016-08-31 DIAGNOSIS — K045 Chronic apical periodontitis: Secondary | ICD-10-CM

## 2016-08-31 DIAGNOSIS — C321 Malignant neoplasm of supraglottis: Secondary | ICD-10-CM

## 2016-08-31 DIAGNOSIS — K083 Retained dental root: Secondary | ICD-10-CM

## 2016-08-31 DIAGNOSIS — Z992 Dependence on renal dialysis: Secondary | ICD-10-CM | POA: Insufficient documentation

## 2016-08-31 DIAGNOSIS — Z794 Long term (current) use of insulin: Secondary | ICD-10-CM | POA: Diagnosis not present

## 2016-08-31 DIAGNOSIS — K0889 Other specified disorders of teeth and supporting structures: Secondary | ICD-10-CM | POA: Insufficient documentation

## 2016-08-31 DIAGNOSIS — I129 Hypertensive chronic kidney disease with stage 1 through stage 4 chronic kidney disease, or unspecified chronic kidney disease: Secondary | ICD-10-CM | POA: Diagnosis not present

## 2016-08-31 DIAGNOSIS — E1122 Type 2 diabetes mellitus with diabetic chronic kidney disease: Secondary | ICD-10-CM | POA: Insufficient documentation

## 2016-08-31 DIAGNOSIS — E785 Hyperlipidemia, unspecified: Secondary | ICD-10-CM | POA: Insufficient documentation

## 2016-08-31 DIAGNOSIS — K029 Dental caries, unspecified: Secondary | ICD-10-CM

## 2016-08-31 DIAGNOSIS — N189 Chronic kidney disease, unspecified: Secondary | ICD-10-CM | POA: Insufficient documentation

## 2016-08-31 DIAGNOSIS — Z79899 Other long term (current) drug therapy: Secondary | ICD-10-CM | POA: Diagnosis not present

## 2016-08-31 DIAGNOSIS — Z87891 Personal history of nicotine dependence: Secondary | ICD-10-CM | POA: Insufficient documentation

## 2016-08-31 DIAGNOSIS — K0530 Chronic periodontitis, unspecified: Secondary | ICD-10-CM

## 2016-08-31 HISTORY — PX: MULTIPLE EXTRACTIONS WITH ALVEOLOPLASTY: SHX5342

## 2016-08-31 LAB — POCT I-STAT 4, (NA,K, GLUC, HGB,HCT)
GLUCOSE: 140 mg/dL — AB (ref 65–99)
HEMATOCRIT: 39 % (ref 39.0–52.0)
Hemoglobin: 13.3 g/dL (ref 13.0–17.0)
Potassium: 4.3 mmol/L (ref 3.5–5.1)
Sodium: 132 mmol/L — ABNORMAL LOW (ref 135–145)

## 2016-08-31 LAB — GLUCOSE, CAPILLARY: Glucose-Capillary: 175 mg/dL — ABNORMAL HIGH (ref 65–99)

## 2016-08-31 SURGERY — MULTIPLE EXTRACTION WITH ALVEOLOPLASTY
Anesthesia: General

## 2016-08-31 MED ORDER — CEFAZOLIN SODIUM-DEXTROSE 2-4 GM/100ML-% IV SOLN
2.0000 g | Freq: Once | INTRAVENOUS | Status: AC
  Administered 2016-08-31: 2 g via INTRAVENOUS
  Filled 2016-08-31: qty 100

## 2016-08-31 MED ORDER — ALBUMIN HUMAN 5 % IV SOLN
INTRAVENOUS | Status: DC | PRN
  Administered 2016-08-31: 08:00:00 via INTRAVENOUS

## 2016-08-31 MED ORDER — LIDOCAINE-EPINEPHRINE 2 %-1:100000 IJ SOLN
INTRAMUSCULAR | Status: DC | PRN
  Administered 2016-08-31 (×6): 1.7 mL via INTRADERMAL

## 2016-08-31 MED ORDER — LIDOCAINE-EPINEPHRINE 2 %-1:100000 IJ SOLN
INTRAMUSCULAR | Status: AC
  Filled 2016-08-31: qty 3.4

## 2016-08-31 MED ORDER — OXYMETAZOLINE HCL 0.05 % NA SOLN
NASAL | Status: DC | PRN
  Administered 2016-08-31: 1

## 2016-08-31 MED ORDER — OXYCODONE HCL 5 MG PO TABS: 5.0000 mg | ORAL_TABLET | Freq: Once | ORAL | Status: DC | PRN

## 2016-08-31 MED ORDER — PROMETHAZINE HCL 25 MG/ML IJ SOLN: 6.2500 mg | INTRAMUSCULAR | Status: DC | PRN

## 2016-08-31 MED ORDER — FENTANYL CITRATE (PF) 250 MCG/5ML IJ SOLN
INTRAMUSCULAR | Status: AC
  Filled 2016-08-31: qty 5

## 2016-08-31 MED ORDER — MEPERIDINE HCL 25 MG/ML IJ SOLN: 6.2500 mg | INTRAMUSCULAR | Status: DC | PRN

## 2016-08-31 MED ORDER — PROPOFOL 10 MG/ML IV BOLUS
INTRAVENOUS | Status: AC
  Filled 2016-08-31: qty 20

## 2016-08-31 MED ORDER — GLYCOPYRROLATE 0.2 MG/ML IJ SOLN
INTRAMUSCULAR | Status: DC | PRN
  Administered 2016-08-31: 0.4 mg via INTRAVENOUS

## 2016-08-31 MED ORDER — LIDOCAINE HCL (CARDIAC) 20 MG/ML IV SOLN
INTRAVENOUS | Status: DC | PRN
  Administered 2016-08-31: 100 mg via INTRAVENOUS

## 2016-08-31 MED ORDER — BUPIVACAINE-EPINEPHRINE 0.5% -1:200000 IJ SOLN
INTRAMUSCULAR | Status: DC | PRN
  Administered 2016-08-31 (×2): 1.8 mL

## 2016-08-31 MED ORDER — MIDAZOLAM HCL 2 MG/2ML IJ SOLN
INTRAMUSCULAR | Status: AC
  Filled 2016-08-31: qty 2

## 2016-08-31 MED ORDER — PROPOFOL 10 MG/ML IV BOLUS
INTRAVENOUS | Status: DC | PRN
  Administered 2016-08-31: 200 mg via INTRAVENOUS
  Administered 2016-08-31: 50 mg via INTRAVENOUS

## 2016-08-31 MED ORDER — OXYCODONE-ACETAMINOPHEN 5-325 MG PO TABS: ORAL_TABLET | ORAL | 0 refills | Status: DC

## 2016-08-31 MED ORDER — OXYCODONE-ACETAMINOPHEN 5-325 MG PO TABS
1.0000 | ORAL_TABLET | ORAL | Status: DC | PRN
  Administered 2016-08-31: 2 via ORAL

## 2016-08-31 MED ORDER — SODIUM CHLORIDE 0.9 % IV SOLN
INTRAVENOUS | Status: DC | PRN
  Administered 2016-08-31 (×2): via INTRAVENOUS

## 2016-08-31 MED ORDER — PHENYLEPHRINE HCL 10 MG/ML IJ SOLN
INTRAMUSCULAR | Status: DC | PRN
  Administered 2016-08-31: 120 ug via INTRAVENOUS
  Administered 2016-08-31: 40 ug via INTRAVENOUS
  Administered 2016-08-31: 120 ug via INTRAVENOUS
  Administered 2016-08-31 (×3): 80 ug via INTRAVENOUS
  Administered 2016-08-31: 200 ug via INTRAVENOUS
  Administered 2016-08-31 (×3): 40 ug via INTRAVENOUS

## 2016-08-31 MED ORDER — HYDROMORPHONE HCL 1 MG/ML IJ SOLN
0.2500 mg | INTRAMUSCULAR | Status: DC | PRN
  Administered 2016-08-31 (×3): 0.5 mg via INTRAVENOUS

## 2016-08-31 MED ORDER — SUCCINYLCHOLINE CHLORIDE 20 MG/ML IJ SOLN
INTRAMUSCULAR | Status: DC | PRN
  Administered 2016-08-31: 60 mg via INTRAVENOUS

## 2016-08-31 MED ORDER — OXYCODONE-ACETAMINOPHEN 5-325 MG PO TABS
ORAL_TABLET | ORAL | Status: AC
  Filled 2016-08-31: qty 2

## 2016-08-31 MED ORDER — LACTATED RINGERS IV SOLN: INTRAVENOUS | Status: DC

## 2016-08-31 MED ORDER — ONDANSETRON HCL 4 MG/2ML IJ SOLN
INTRAMUSCULAR | Status: DC | PRN
  Administered 2016-08-31: 4 mg via INTRAVENOUS

## 2016-08-31 MED ORDER — FENTANYL CITRATE (PF) 100 MCG/2ML IJ SOLN
INTRAMUSCULAR | Status: DC | PRN
  Administered 2016-08-31: 50 ug via INTRAVENOUS
  Administered 2016-08-31: 25 ug via INTRAVENOUS
  Administered 2016-08-31: 100 ug via INTRAVENOUS
  Administered 2016-08-31 (×3): 25 ug via INTRAVENOUS

## 2016-08-31 MED ORDER — OXYMETAZOLINE HCL 0.05 % NA SOLN
NASAL | Status: AC
  Filled 2016-08-31: qty 15

## 2016-08-31 MED ORDER — LIDOCAINE-EPINEPHRINE 2 %-1:100000 IJ SOLN
INTRAMUSCULAR | Status: AC
  Filled 2016-08-31: qty 10.2

## 2016-08-31 MED ORDER — HYDROMORPHONE HCL 1 MG/ML IJ SOLN
INTRAMUSCULAR | Status: AC
  Administered 2016-08-31: 0.5 mg via INTRAVENOUS
  Filled 2016-08-31: qty 0.5

## 2016-08-31 MED ORDER — VECURONIUM BROMIDE 10 MG IV SOLR
INTRAVENOUS | Status: DC | PRN
  Administered 2016-08-31: 2 mg via INTRAVENOUS

## 2016-08-31 MED ORDER — HYDROMORPHONE HCL 1 MG/ML IJ SOLN
INTRAMUSCULAR | Status: DC
Start: 2016-08-31 — End: 2016-08-31
  Filled 2016-08-31: qty 0.5

## 2016-08-31 MED ORDER — EPHEDRINE SULFATE 50 MG/ML IJ SOLN
INTRAMUSCULAR | Status: DC | PRN
  Administered 2016-08-31: 5 mg via INTRAVENOUS

## 2016-08-31 MED ORDER — 0.9 % SODIUM CHLORIDE (POUR BTL) OPTIME
TOPICAL | Status: DC | PRN
  Administered 2016-08-31: 1000 mL

## 2016-08-31 MED ORDER — BUPIVACAINE-EPINEPHRINE (PF) 0.5% -1:200000 IJ SOLN
INTRAMUSCULAR | Status: AC
  Filled 2016-08-31: qty 3.6

## 2016-08-31 MED ORDER — MIDAZOLAM HCL 5 MG/5ML IJ SOLN
INTRAMUSCULAR | Status: DC | PRN
  Administered 2016-08-31: 2 mg via INTRAVENOUS

## 2016-08-31 MED ORDER — NEOSTIGMINE METHYLSULFATE 10 MG/10ML IV SOLN
INTRAVENOUS | Status: DC | PRN
  Administered 2016-08-31: 3 mg via INTRAVENOUS

## 2016-08-31 MED ORDER — OXYCODONE HCL 5 MG/5ML PO SOLN: 5.0000 mg | Freq: Once | ORAL | Status: DC | PRN

## 2016-08-31 SURGICAL SUPPLY — 37 items
ALCOHOL 70% 16 OZ (MISCELLANEOUS) ×3 IMPLANT
ATTRACTOMAT 16X20 MAGNETIC DRP (DRAPES) ×3 IMPLANT
BLADE SURG 15 STRL LF DISP TIS (BLADE) ×2 IMPLANT
BLADE SURG 15 STRL SS (BLADE) ×4
COVER SURGICAL LIGHT HANDLE (MISCELLANEOUS) ×3 IMPLANT
GAUZE PACKING FOLDED 2  STR (GAUZE/BANDAGES/DRESSINGS) ×4
GAUZE PACKING FOLDED 2 STR (GAUZE/BANDAGES/DRESSINGS) ×2 IMPLANT
GAUZE SPONGE 4X4 16PLY XRAY LF (GAUZE/BANDAGES/DRESSINGS) ×6 IMPLANT
GLOVE BIOGEL PI IND STRL 6 (GLOVE) ×1 IMPLANT
GLOVE BIOGEL PI INDICATOR 6 (GLOVE) ×2
GLOVE SURG ORTHO 8.0 STRL STRW (GLOVE) ×3 IMPLANT
GLOVE SURG SS PI 6.0 STRL IVOR (GLOVE) ×3 IMPLANT
GOWN STRL REUS W/ TWL LRG LVL3 (GOWN DISPOSABLE) ×1 IMPLANT
GOWN STRL REUS W/TWL 2XL LVL3 (GOWN DISPOSABLE) ×3 IMPLANT
GOWN STRL REUS W/TWL LRG LVL3 (GOWN DISPOSABLE) ×2
HEMOSTAT SURGICEL 2X14 (HEMOSTASIS) ×3 IMPLANT
KIT BASIN OR (CUSTOM PROCEDURE TRAY) ×3 IMPLANT
KIT ROOM TURNOVER OR (KITS) ×3 IMPLANT
MANIFOLD NEPTUNE WASTE (CANNULA) ×3 IMPLANT
NEEDLE 27GAX1/2IN MONOJET (NEEDLE) ×6 IMPLANT
NEEDLE BLUNT 16X1.5 OR ONLY (NEEDLE) ×3 IMPLANT
NS IRRIG 1000ML POUR BTL (IV SOLUTION) ×3 IMPLANT
PACK EENT II TURBAN DRAPE (CUSTOM PROCEDURE TRAY) ×3 IMPLANT
PAD ARMBOARD 7.5X6 YLW CONV (MISCELLANEOUS) ×3 IMPLANT
SPONGE SURGIFOAM ABS GEL 100 (HEMOSTASIS) IMPLANT
SPONGE SURGIFOAM ABS GEL 12-7 (HEMOSTASIS) IMPLANT
SPONGE SURGIFOAM ABS GEL SZ50 (HEMOSTASIS) IMPLANT
SUCTION FRAZIER HANDLE 10FR (MISCELLANEOUS) ×2
SUCTION TUBE FRAZIER 10FR DISP (MISCELLANEOUS) ×1 IMPLANT
SUT CHROMIC 3 0 PS 2 (SUTURE) ×6 IMPLANT
SUT CHROMIC 4 0 P 3 18 (SUTURE) IMPLANT
SYR 50ML SLIP (SYRINGE) ×3 IMPLANT
TOWEL OR 17X26 10 PK STRL BLUE (TOWEL DISPOSABLE) ×3 IMPLANT
TUBE CONNECTING 12'X1/4 (SUCTIONS) ×1
TUBE CONNECTING 12X1/4 (SUCTIONS) ×2 IMPLANT
WATER TABLETS ICX (MISCELLANEOUS) ×3 IMPLANT
YANKAUER SUCT BULB TIP NO VENT (SUCTIONS) ×3 IMPLANT

## 2016-08-31 NOTE — Transfer of Care (Signed)
Immediate Anesthesia Transfer of Care Note  Patient: Kevin Deleon  Procedure(s) Performed: Procedure(s): Extraction of tooth #'s 2-10, 13-15, and 19-30 with alveoloplasty (N/A)  Patient Location: PACU  Anesthesia Type:General  Level of Consciousness: awake, alert , oriented and patient cooperative  Airway & Oxygen Therapy: Patient Spontanous Breathing and Patient connected to face mask oxygen  Post-op Assessment: Report given to RN and Post -op Vital signs reviewed and stable  Post vital signs: Reviewed and stable  Last Vitals:  Vitals:   08/31/16 0619 08/31/16 0936  BP: 126/67 (!) 151/88  Pulse: 93 95  Resp: 18   Temp: 36.6 C 36.1 C    Last Pain:  Vitals:   08/31/16 0619  TempSrc: Oral         Complications: No apparent anesthesia complications

## 2016-08-31 NOTE — H&P (Signed)
08/31/2016  Patient:            Kevin Deleon Date of Birth:  07-Aug-1970 MRN:                [id number]   BP 126/67   Pulse 93   Temp 97.8 F (36.6 C) (Oral)   Resp 18   SpO2 96%   [name] presents for multiple dental extractions with alveoloplasty in the OR with general anesthesia as part of a pre-radiation therapy dental protocol.  Patient denies acute medical or dental changes.  Please see note from Dr. Isidore Moos dated 08/28/16 to act as H and P for the dental OR procedure.  Dr. Bonner Puna, MD  Radiation Oncology  Expand All Collapse All   [] Hide copied text  Radiation Oncology         [phone]) [phone] ________________________________  Initial outpatient Consultation  Name: Kevin Deleon         MRN: [id number]         Date: 08/28/2016                      DOB: 04-21-1971  FV:CBSWHQ, Marjory Lies, MD  London Pepper, MD  Melony Overly MD,  Manon Hilding MD,  Berniece Andreas MD   REFERRING PHYSICIAN: London Pepper, MD   DIAGNOSIS:    ICD-9-CM ICD-10-CM   1. 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 male who presented with dysphagia and sore throat for 1 month.  Patient was discussed at tumor board this week. He had a CT scan on 08/14/16 which showed a circumferential mass at the inferior oropharynx/supraglottis extending over 5 cm. There was epiglottic involvement. Lymph nodes were normal. PET scan on 08/19/16 was also reviewed at tumor board. This showed a primary epiglottic lesion. It appeared that the entire epiglottis was involved +/- AE folds. There were no hypermetabolic lymph nodes. There were 2 solid subcentimeter pulmonary  nodules that were indeterminate.   Biopsy of epiglottis on 08/17/16 revealed squamous cell carcinoma with patchy p16 positivity. I read Dr Pollie Friar op note.  The patient has seen dentistry with reccomendations for full extractions on 08/31/16. Patient's family member contacted our patient navigator prior to consult today. She expressed significant anxiety on the part of the patient complicated by his mental illness. She expressed concerns about his ability to follow through with all recommendations, including dental extractions.  The patient has a history of limited compliance with his antipsychotic medications for chronic schizoaffective disorder. According to family his memory and comprehension are selective and inconsistent. His kidney failure is due in part to taking lithium for almost 20 years and not getting blood tests regularly.  The patient reports to the clinic today to discuss the role that radiation therapy may play in the treatment of his disease. He is accompanied by his parents. We are joined today by Gayleen Orem, RN, Head and Neck Nurse Navigator and Polo Riley, LCSW.   On review of systems, the patient denies recent weight changes. He denies swallowing concerns at this time. The patient does not have a PEG tube in place. The patient stopped smoking 2 weeks ago.  He is currently using a nicotine patch and gum. Prior to smoking cessation he smoked one pack daily for approximately 20 years. He denies smokeless tobacco, drug or alcohol use.   PREVIOUS RADIATION THERAPY: No  PAST MEDICAL HISTORY:  has a past medical history of Chronic kidney disease; Diabetes mellitus ([place]); Dialysis patient [facility]); GERD (gastroesophageal reflux disease); HTN (hypertension); Hyperlipidemia; Liver disease; Mental disorder; Schizophrenia ([place]); Smoker; and Smokers' cough ([place]).    PAST SURGICAL HISTORY:      Past Surgical History:  Procedure Laterality Date  . AV FISTULA PLACEMENT Left 05/24/2013     Procedure: ARTERIOVENOUS (AV) FISTULA CREATION BRACHIAL-CEPHALIC;  Surgeon: Conrad White Center, MD;  Location: [facility];  Service: Vascular;  Laterality: Left;  . DIRECT LARYNGOSCOPY N/A 08/17/2016   Procedure: DIRECT LARYNGOSCOPY WITH BIOPSY AND FROZEN SECTION;  Surgeon: Rozetta Nunnery, MD;  Location: [facility];  Service: ENT;  Laterality: N/A;    FAMILY HISTORY: family history includes Cancer in his other; Cancer - Lung in his other.  SOCIAL HISTORY:  reports that he quit smoking about 2 weeks ago. His smoking use included Cigarettes. He has a 20.00 pack-year smoking history. He has never used smokeless tobacco. He reports that he does not drink alcohol or use drugs. The patient lives alone in [place]. He likes to watch tv and movies. His parents live in [place]. The patient quit smoking in April 2018.  ALLERGIES: Patient has no known allergies.  MEDICATIONS:        Current Outpatient Prescriptions  Medication Sig Dispense Refill  . glipiZIDE (GLUCOTROL) 5 MG tablet Take 5 mg by mouth daily at 12 noon.     Marland Kitchen LANTUS SOLOSTAR 100 UNIT/ML injection Inject 28 Units into the skin at bedtime.     . nicotine (NICODERM CQ - DOSED IN MG/24 HOURS) 21 mg/24hr patch Place 21 mg onto the skin daily.    . ONE TOUCH ULTRA TEST test strip 1 each by Other route every evening.     . risperiDONE (RISPERDAL) 2 MG tablet Take 1 tablet by mouth at  bedtime (Patient taking differently: Take 2 mg by mouth daily at 12 noon. ) 90 tablet 0  . simvastatin (ZOCOR) 20 MG tablet Take 20 mg by mouth daily at 12 noon.     . traZODone (DESYREL) 50 MG tablet Take 1 - 2 tablets at bedtime as needed for sleep 180 tablet 0  . multivitamin (RENA-VIT) TABS tablet Take 1 tablet by mouth daily.     No current facility-administered medications for this encounter.     REVIEW OF SYSTEMS:  Notable for that above.   PHYSICAL EXAM:  height is 5\' 11"  (1.803 m) and weight is 226 lb 12.8 oz  (102.9 kg). His temperature is 97.9 F (36.6 C). His blood pressure is 107/68 and his pulse is 93. His oxygen saturation is 100%.   General: Alert and oriented, in no acute distress. HEENT: Extraocular movements are intact. Mucous membranes are moist. He is missing some of his teeth; he has some metalwork in his remaining teeth. He has a large uvula, and the right tonsil slightly larger than the left. No sign of tumor in the oral cavity or upper oropharynx. Neck: No palpable masses in the cervical or supraclavicular regions. Heart: Regular in rate and rhythm with no murmurs. Chest: Clear to auscultation bilaterally. Abdomen: Slightly distended but without tenderness or rigidity. Extremities: No edema in wrists or ankles. In the left antecubital region the patient  has an intravenous fistula used for hemodialysis. Lymphatics: see Neck Exam Skin: No concerning lesions. Musculoskeletal: symmetric strength and muscle tone throughout. Neurologic: No obvious focalities. Speech is fluent. Coordination is intact. EOMI. Psychiatric: Judgment and insight are intact. Affect is appropriate. Patient is alert and oriented x 4.  Laryngoscopy was deferred today per the patient.   ECOG = 2   LABORATORY DATA:  RecentLabs       Lab Results  Component Value Date   WBC 10.9 (H) 08/26/2016   HGB 14.5 08/26/2016   HCT 44.6 08/26/2016   MCV 97.8 08/26/2016   PLT 244 08/26/2016     CMP     Labs(Brief)          Component Value Date/Time   NA 137 08/26/2016 0937   K 4.6 08/26/2016 0937   CL 95 (L) 08/26/2016 0937   CO2 28 08/26/2016 0937   GLUCOSE 91 08/26/2016 0937   BUN 25 (H) 08/26/2016 0937   CREATININE 5.51 (H) 08/26/2016 0937   CALCIUM 9.6 08/26/2016 0937   PROT 7.1 08/26/2016 0937   ALBUMIN 3.7 08/26/2016 0937   AST 15 08/26/2016 0937   ALT 9 (L) 08/26/2016 0937   ALKPHOS 88 08/26/2016 0937   BILITOT 0.4 08/26/2016 0937   GFRNONAA 11 (L) 08/26/2016 0937    GFRAA 13 (L) 08/26/2016 0937       RecentLabs  No results found for: TSH       RADIOGRAPHY:  ImagingResults  Ct Soft Tissue Neck W Contrast  Result Date: 08/14/2016 CLINICAL DATA:  Dysphagia. Hoarseness. Sore throat. Duration of symptoms 1 month. EXAM: CT NECK WITH CONTRAST TECHNIQUE: Multidetector CT imaging of the neck was performed using the standard protocol following the bolus administration of intravenous contrast. CONTRAST:  67mL ISOVUE-300 IOPAMIDOL (ISOVUE-300) INJECTION 61% COMPARISON:  None. FINDINGS: Pharynx and larynx: Extensive inferior oropharyngeal/supraglottic mass with circumferential extension extending over a length of almost 5 cm including involvement of the epiglottis. No glottic or subglottic mass. Salivary glands: Parotid and submandibular glands are normal. Thyroid: Normal Lymph nodes: No enlarged or low-density nodes on either side of the neck. Vascular: Carotid bifurcation atherosclerosis. Limited intracranial: Normal Visualized orbits: Normal Mastoids and visualized paranasal sinuses: Clear Skeleton: Normal Upper chest: Normal Other: None IMPRESSION: Circumferential mass lesion at the inferior oropharynx/supraglottic region extending over a length of almost 5 cm. Epiglottic involvement. 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 Lymph nodes are within normal limits in size and density. Electronically Signed   By: Nelson Chimes M.D.   On: 08/14/2016 11:46   Nm Pet Image Initial (pi) Skull Base To Thigh  Result Date: 08/19/2016 CLINICAL DATA:  Initial treatment strategy for squamous cell carcinoma of the epiglottis diagnosed on 08/17/2016 biopsy. EXAM: NUCLEAR MEDICINE PET SKULL BASE TO THIGH TECHNIQUE: 11.2 mCi F-18 FDG was injected intravenously. Full-ring PET imaging was performed from the skull base to thigh after the radiotracer. CT data was obtained and used for attenuation correction and anatomic localization. FASTING BLOOD GLUCOSE:  Value: 60 mg/dl  COMPARISON:  08/14/2016 CT neck. FINDINGS: NECK Intense hypermetabolism associated with the irregular thickening of the entire epiglottis with max SUV 23.4. No enlarged or hypermetabolic lymph nodes in the neck, including no enlarged or hypermetabolic lateral retropharyngeal nodes. CHEST Left anterior descending coronary atherosclerosis. Atherosclerotic nonaneurysmal thoracic aorta. No enlarged or hypermetabolic axillary, mediastinal or hilar lymph nodes. No pneumothorax. No pleural effusions. Right upper lobe 4 mm solid pulmonary nodule (series 8/ image 29) and anterior left lower lobe 5  mm pulmonary nodule (series 8/image 30), below PET resolution, not associated with significant metabolism. No acute consolidative airspace disease or additional significant pulmonary nodules. ABDOMEN/PELVIS Nonspecific hypermetabolism in the first portion of the duodenum, with no discrete mass or wall thickening in this location on the CT images. Left adrenal 2.1 cm adenoma with density -7 HU, with metabolism below that of the liver. No additional discrete adrenal nodules. Nonspecific hypermetabolism at the anorectal junction with max SUV 8.3, with no discrete mass or definite wall thickening in this location on the CT images. No abnormal hypermetabolic activity within the liver, pancreas, or spleen. No hypermetabolic lymph nodes in the abdomen or pelvis. Symmetric renal atrophy. No hydronephrosis. Mild diffuse colonic diverticulosis. Atherosclerotic nonaneurysmal abdominal aorta. SKELETON No focal hypermetabolic activity to suggest skeletal metastasis. IMPRESSION: 1. Intense hypermetabolism associated with irregular thickening of the entire epiglottis, compatible with known epiglottic malignancy. 2. No hypermetabolic locoregional or distant metastatic disease. 3. Two solid pulmonary nodules, largest 5 mm, below PET resolution, indeterminate. Recommend attention on initial chest CT follow-up in 3-6 months. 4. Nonspecific  hypermetabolism in the first portion of the duodenum without definite CT correlate, cannot exclude peptic ulcer disease or other etiologies. Further evaluation such as upper endoscopy can be performed as clinically warranted. 5. Nonspecific hypermetabolism at the anorectal junction without definite CT correlate, for which correlation with clinical exam and history is advised. 6. Additional findings include aortic atherosclerosis, 1 vessel coronary atherosclerosis, diffuse mild colonic diverticulosis and left adrenal adenoma. Electronically Signed   By: Ilona Sorrel M.D.   On: 08/19/2016 10:14       IMPRESSION/PLAN: This is a delightful patient with epiglottic head and neck cancer. I would recommend radiotherapy for this patient. The patient is a good candidate for 7 weeks of radiotherapy. Chemotherapy is not recommended per tumor board discussion w/ med/onc.  We discussed the potential risks, benefits, and side effects of radiotherapy. We talked in detail about acute and late effects. We discussed that some of the most bothersome acute effects may be mucositis, dysgeusia, salivary changes, skin irritation, hair loss, dehydration, weight loss and fatigue. We talked about late effects which include but are not necessarily limited to dysphagia, hypothyroidism, nerve injury, spinal cord injury, xerostomia, trismus, and neck edema. No guarantees of treatment were given. A consent form was signed and placed in the patient's medical record. The patient is enthusiastic about proceeding with treatment. I look forward to participating in the patient's care.    Simulation (treatment planning) will take place following clearance by Dr. Enrique Sack.  We also discussed that the treatment of head and neck cancer is a multidisciplinary process to maximize treatment outcomes and quality of life. For this reasons the following referrals have been or will be made:  1) Nutritionist for nutrition support during and after  treatment.  2) Speech language pathology for swallowing and/or speech therapy. I will order a swallowing study to assess for aspiration prior to meeting with the swallowing therapist.  3) Social work for social support.   4) Physical therapy due to risk of lymphedema in neck and deconditioning.  5) Baseline labs including TSH.  Patient declined laryngoscopy today despite my recommendation.  I have reviewed this patient with Dr. Alvy Bimler. I discussed with the patient and his family that the patient is likely not a good candidate for chemotherapy.  Full dental extractions have been recommended for 4/16. The patient is ammenable to this. He is agreeable with all plans of care. PEG tube will be  placed only if needed - per today's discussion.  The patient sees Dr. Barnabas Lister Deterding in Nephrology. His PCP is Dr. London Pepper. Psychiatrist - Dr Berniece Andreas. I will copy them both on my note to review the patient's plan for treatment, as well as the patient's contraindications.    __________________________________________   Eppie Gibson, MD  This document serves as a record of services personally performed by Eppie Gibson, MD. It was created on her behalf by Maryla Morrow, a trained medical scribe. The creation of this record is based on the scribe's personal observations and the provider's statements to them. This document has been checked and approved by the attending provider.    Electronically signed by Eppie Gibson, MD at 08/28/2016 10:14 AM Electronically signed by Eppie Gibson, MD at 08/28/2016 10:16 AM

## 2016-08-31 NOTE — Anesthesia Procedure Notes (Signed)
Procedure Name: Intubation Date/Time: 08/31/2016 7:52 AM Performed by: Shirlyn Goltz Pre-anesthesia Checklist: Patient identified, Emergency Drugs available, Suction available and Patient being monitored Patient Re-evaluated:Patient Re-evaluated prior to inductionOxygen Delivery Method: Circle system utilized Preoxygenation: Pre-oxygenation with 100% oxygen Intubation Type: IV induction Ventilation: Mask ventilation without difficulty Laryngoscope Size: Glidescope and 4 Grade View: Grade II Nasal Tubes: Right, Nasal prep performed and Nasal Rae Tube size: 7.0 mm Number of attempts: 1 Placement Confirmation: ETT inserted through vocal cords under direct vision,  positive ETCO2 and breath sounds checked- equal and bilateral Tube secured with: Tape Dental Injury: Teeth and Oropharynx as per pre-operative assessment

## 2016-08-31 NOTE — Op Note (Signed)
OPERATIVE REPORT  Patient:            Kevin Deleon Date of Birth:  08/20/1970 MRN:                [id number]   DATE OF PROCEDURE:  08/31/2016  PREOPERATIVE DIAGNOSES: 1. Squamous cell carcinoma of the Epiglottis 2. Preradiation Therapy Dental Protocol 3. Chronic Apical Periodontitis 4. Dental Caries 5. Chronic Periodontitis 6. Loose Teeth  POSTOPERATIVE DIAGNOSES: 1. Squamous cell carcinoma of the Epiglottis 2. Preradiation Therapy Dental Protocol 3. Chronic Apical Periodontitis 4. Dental Caries 5. Chronic Periodontitis 6. Loose Teeth  OPERATIONS: 1. Multiple extraction of tooth numbers 2-10,13-15, and 19-30 2. 4 Quadrants of alveoloplasty   SURGEON: Lenn Cal, DDS  ASSISTANT: Camie Patience, (dental assistant)  ANESTHESIA: General anesthesia via nasoendotracheal tube.  MEDICATIONS: 1. Ancef 2 g IV prior to invasive dental procedures. 2. Local anesthesia with a total utilization of 6 carpules each containing 34 mg of lidocaine with 0.017 mg of epinephrine as well as 2 carpules each containing 9 mg of bupivacaine with 0.009 mg of epinephrine.  SPECIMENS: There are 24 teeth that were discarded.  DRAINS: None  CULTURES: None  COMPLICATIONS: None   ESTIMATED BLOOD LOSS: 100 mLs.  INTRAVENOUS FLUIDS:  1. 500 mLs of normal saline solution 2. 250 mLs of albumin  INDICATIONS: The patient was recently diagnosed with squamous cell carcinoma of the epiglottis.  A medically necessary dental consultation was then requested to evaluate poor dentition.  The patient was examined and treatment planned for extraction of remaining teeth with alveoloplasty in the operating room with general anesthesia.  This treatment plan was formulated to decrease the risks and complications associated with dental infection from affecting the patient's systemic health and to prevent future complications associated with the radiation therapy to include infection and  osteoradionecrosis.  OPERATIVE FINDINGS: Patient was examined operating room number 10.  The teeth were identified for extraction. The patient was noted be affected by chronic periodontitis, loose teeth, chronic apical periodontitis, and dental caries.   DESCRIPTION OF PROCEDURE: Patient was brought to the main operating room number 10. Patient was then placed in the supine position on the operating table. General anesthesia was then induced per the anesthesia team. The patient was then prepped and draped in the usual manner for dental medicine procedure. A timeout was performed. The patient was identified and procedures were verified. A throat pack was placed at this time. The oral cavity was then thoroughly examined with the findings noted above. The patient was then ready for dental medicine procedure as follows:  Local anesthesia was then administered sequentially with a total utilization of 6 carpules each containing 34 mg of lidocaine with 0.017 mg of epinephrine as well as 2 carpules  each containing 9 mg bupivacaine with 0.009 mg of epinephrine.  The Maxillary left and right quadrants first approached. Anesthesia was then delivered utilizing infiltration with lidocaine with epinephrine. A #15 blade incision was then made from the maxillary right tuberosity and extended to the maxillary left tuberosity..  A  surgical flap was then carefully reflected. The upper teeth were then subluxated with a series of straight elevators. Tooth numbers 2-10,13-15 were then removed with a 150 forceps without complications. Alveoloplasty was then performed utilizing a ronguers and bone file. The surgical site was then irrigated with copious amounts of sterile saline. The tissues were approximated and trimmed appropriately. The surgical site was then closed from the maxillary right tuberosity and extended the mesial #  8 utilizing 3-0 chromic gut suture in a continuous interrupted suture technique 1. The maxillary  left quadrant was then closed from the maxillary left tuberosity and extended to the mesial #9 utilizing 3-0 chromic gut suture in a continuous interrupted suture technique 1.  At this point time, the mandibular quadrants were approached. The patient was given bilateral inferior alveolar nerve blocks and long buccal nerve blocks utilizing the bupivacaine with epinephrine. Further infiltration was then achieved utilizing the lidocaine with epinephrine. A 15 blade incision was then made from the distal of number 17 and extended to the distal of #32.  A surgical flap was then carefully reflected. The lower teeth were then subluxated with a series straight elevators. Tooth numbers 19-30 were then removed utilizing a 151 forceps. Alveoloplasty was then performed utilizing a rongeurs and bone file. The tissues were approximated and trimmed appropriately. The surgical sites were then irrigated with copious amounts of sterile saline. The mandibular left surgical site was then closed from the distal of #17 and extended to the mesial #24 utilizing 3-0 chromic gut suture in a continuous interrupted suture technique 1. The mandibular right surgical site was then closed from the distal of 32 and extended to the mesial of 25 utilizing 3-0 chromic gut suture in a continuous interrupted suture technique x1. Two interrupted sutures are then placed to further close the surgical site utilizing 3-0 chromic gut material.  At this point time, the entire mouth was irrigated with copious amounts of sterile saline. The patient was examined for complications, seeing none, the dental medicine procedure was deemed to be complete. The throat pack was removed at this time. An oral airway was then placed at the request of the anesthesia team. A series of 4 x 4 gauze were placed in the mouth to aid hemostasis. The patient was then handed over to the anesthesia team for final disposition. After an appropriate amount of time, the patient was  extubated and taken to the postanesthsia care unit in good condition. All counts were correct for the dental medicine procedure. The patient will return to dental medicine in 7-10 days for evaluation for suture removal.  Barring any complications, the patient should be able to proceed with radiation therapy starting on 09/14/2016.   Lenn Cal, DDS.

## 2016-08-31 NOTE — Discharge Instructions (Signed)

## 2016-08-31 NOTE — Progress Notes (Signed)
Oncology Nurse Navigator Documentation  Met with Kevin Deleon during initial consult with Dr. Isidore Moos.  He was accompanied by his parents.  1. Further introduced myself as his Navigator, explained my role as a member of the Care Team.   2. Provided New Patient Information packet, discussed contents:  Contact information for physician(s), myself, other members of the Care Team.  Advance Directive information ([place] blue pamphlet with LCSW contact info)  Fall Prevention Patient [name] sheet  [name] map with highlight of [name] 3. Provided introductory explanation of radiation treatment including SIM planning and purpose of Aquaplast head and shoulder mask, showed them example.   4. Provided and discussed education handout for PEG. 5. Of note:  Dental extractions with Dr. Enrique Sack scheduled ffor 4/16.  He stated stopped smoling 1 week ago.  We discussed his attendance at the 4/24 [name].  Lives alone.  His parents will be providing transportation.  RT tmts will be scheduled to accommodate Tuesday and Thursday 11:00 dialysis appts.  6. Provided a tour of SIM and [place] areas, explained treatment and arrival procedures. 7. I encouraged them to contact me with questions/concerns as treatments/procedures begin.  They verbalized understanding of information provided.    Gayleen Orem, RN, BSN, [place] [facility]ck Oncology Nurse [place] [facility] [phone]

## 2016-08-31 NOTE — Progress Notes (Signed)
PRE-OPERATIVE NOTE:  08/31/2016 Kevin Deleon [id number]  VITALS: BP 126/67   Pulse 93   Temp 97.8 F (36.6 C) (Oral)   Resp 18   SpO2 96%   Lab Results  Component Value Date   WBC 10.9 (H) 08/26/2016   HGB 13.3 08/31/2016   HCT 39.0 08/31/2016   MCV 97.8 08/26/2016   PLT 244 08/26/2016   BMET    Component Value Date/Time   NA 132 (L) 08/31/2016 0641   K 4.3 08/31/2016 0641   CL 95 (L) 08/26/2016 0937   CO2 28 08/26/2016 0937   GLUCOSE 140 (H) 08/31/2016 0641   BUN 25 (H) 08/26/2016 0937   CREATININE 5.51 (H) 08/26/2016 0937   CALCIUM 9.6 08/26/2016 0937   GFRNONAA 11 (L) 08/26/2016 0937   GFRAA 13 (L) 08/26/2016 0937    No results found for: INR, PROTIME No results found for: PTT   Kevin Deleon presents for multiple dental extractions with alveoloplasty in the operating room with general anesthesia.   SUBJECTIVE: The patient denies any acute medical or dental changes and agrees to proceed with treatment as planned.  EXAM: No sign of acute dental changes.  ASSESSMENT: Patient is affected by chronic apical periodontitis, retained root segments, dental caries, chronic periodontitis, and loose teeth.  PLAN: Patient agrees to proceed with treatment as planned in the operating room as previously discussed and accepts the risks, benefits, and complications of the proposed treatment. Patient is aware of the risk for bleeding, bruising, swelling, infection, pain, nerve damage, soft tissue damage, sinus involvement, root tip fracture, mandible fracture, and the risks of complications associated with the anesthesia. Patient also is aware of the potential for other complications up to and including death due to his overall medical and respiratory compromise.   Lenn Cal, DDS

## 2016-09-01 ENCOUNTER — Encounter (HOSPITAL_COMMUNITY): Payer: Self-pay | Admitting: Dentistry

## 2016-09-01 NOTE — Anesthesia Postprocedure Evaluation (Signed)
Anesthesia Post Note  Patient: Kevin Deleon  Procedure(s) Performed: Procedure(s) (LRB): Extraction of tooth #'s 2-10, 13-15, and 19-30 with alveoloplasty (N/A)  Patient location during evaluation: PACU Anesthesia Type: General Level of consciousness: sedated and patient cooperative Pain management: pain level controlled Vital Signs Assessment: post-procedure vital signs reviewed and stable Respiratory status: spontaneous breathing Cardiovascular status: stable Anesthetic complications: no       Last Vitals:  Vitals:   08/31/16 1030 08/31/16 1040  BP: (!) 178/117 (!) 175/96  Pulse: 84 80  Resp: (!) 22 (!) 22  Temp: 36.7 C     Last Pain:  Vitals:   08/31/16 1045  TempSrc:   PainSc: 0-No pain                 Nolon Nations

## 2016-09-03 ENCOUNTER — Telehealth: Payer: Self-pay | Admitting: *Deleted

## 2016-09-03 NOTE — Telephone Encounter (Signed)
Oncology Nurse Navigator Documentation  Spoke with patient's mother, informed her of 4/20 2:00 CT SIM with 1:45 arrival to Radiation Waiting, confirmed understanding of 4/24 8:00 arrival to Radiation Waiting following lobby registration for H&N [facility].  Gayleen Orem, RN, BSN, [place] [facility] Oncology Nurse [place] at [place] [phone]

## 2016-09-04 ENCOUNTER — Ambulatory Visit
Admission: RE | Admit: 2016-09-04 | Discharge: 2016-09-04 | Disposition: A | Discharge status: Home / Self Care | Payer: Medicare Other | Source: Ambulatory Visit | Attending: Radiation Oncology | Admitting: Radiation Oncology

## 2016-09-04 ENCOUNTER — Encounter: Payer: Self-pay | Admitting: *Deleted

## 2016-09-04 DIAGNOSIS — C321 Malignant neoplasm of supraglottis: Secondary | ICD-10-CM

## 2016-09-04 DIAGNOSIS — Z510 Encounter for antineoplastic radiation therapy: Secondary | ICD-10-CM | POA: Diagnosis not present

## 2016-09-04 NOTE — Progress Notes (Signed)
Head and Neck Cancer Simulation, IMRT treatment planning note   Outpatient  Diagnosis:    ICD-9-CM ICD-10-CM   1. 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  Mother has expressed many concerns about patient's ability to follow through with treatment. They are under high stress.  I explained I will make my fields a bit smaller than usual to try to lessen side effects.  Goal is 7 weeks of curative radiotherapy.    The patient was taken to the CT simulator and laid in the supine position on the table. An Aquaplast head and shoulder mask was custom fitted to the patient's anatomy. High-resolution CT axial imaging was obtained of the head and neck without contrast. I verified that the quality of the imaging is good for treatment planning. 1 Medically Necessary Treatment Device was fabricated and supervised by me: Aquaplast mask.   Treatment planning note I plan to treat the patient with IMRT. I plan to treat the patient's tumor and bilateral neck nodes. I plan to treat to a total dose of 70 Gray in 35  fractions. Dose calculation was ordered from dosimetry.  IMRT planning Note  IMRT is medically necessary and an important modality to deliver adequate dose to the patient's at risk tissues while sparing the patient's normal structures, including the: esophagus, parotid tissue, mandible, brain stem, spinal cord, oral cavity, brachial plexus.  This justifies the use of IMRT in the patient's treatment.    -----------------------------------  Eppie Gibson, MD

## 2016-09-04 NOTE — Progress Notes (Signed)
Oncology Nurse Navigator Documentation  Met with Mr. Westry following CT SIM.  He was accompanied by his parents. He tolerated SIM without difficulty, denied questions/concerns. I showed them LINAC 1 tmt machine, explained check-in, arrival and preparation procedures. Provided EPIC calendar of appointments to supplement [facility] schedule. They understand to call me with needs/concerns.  Gayleen Orem, RN, BSN, [place] [facility] Oncology Nurse [place] at [place] [phone]

## 2016-09-06 NOTE — Progress Notes (Signed)
A user error has taken place: encounter opened in error, closed for administrative reasons.

## 2016-09-08 ENCOUNTER — Encounter: Payer: Self-pay | Admitting: Radiation Oncology

## 2016-09-08 ENCOUNTER — Encounter: Payer: Self-pay | Admitting: *Deleted

## 2016-09-08 ENCOUNTER — Telehealth (HOSPITAL_COMMUNITY): Payer: Self-pay

## 2016-09-08 ENCOUNTER — Ambulatory Visit: Payer: Medicare Other

## 2016-09-08 ENCOUNTER — Ambulatory Visit: Payer: Medicare Other | Admitting: Nutrition

## 2016-09-08 ENCOUNTER — Ambulatory Visit: Payer: Medicare Other | Attending: Radiation Oncology | Admitting: Physical Therapy

## 2016-09-08 ENCOUNTER — Ambulatory Visit
Admission: RE | Admit: 2016-09-08 | Discharge: 2016-09-08 | Disposition: A | Discharge status: Home / Self Care | Payer: Medicare Other | Source: Ambulatory Visit | Attending: Radiation Oncology | Admitting: Radiation Oncology

## 2016-09-08 VITALS — BP 131/82 | HR 84 | Temp 97.8°F | Wt 231.0 lb

## 2016-09-08 DIAGNOSIS — M6281 Muscle weakness (generalized): Secondary | ICD-10-CM | POA: Diagnosis present

## 2016-09-08 DIAGNOSIS — R29898 Other symptoms and signs involving the musculoskeletal system: Secondary | ICD-10-CM

## 2016-09-08 DIAGNOSIS — R1310 Dysphagia, unspecified: Secondary | ICD-10-CM | POA: Insufficient documentation

## 2016-09-08 DIAGNOSIS — R293 Abnormal posture: Secondary | ICD-10-CM | POA: Diagnosis present

## 2016-09-08 DIAGNOSIS — C321 Malignant neoplasm of supraglottis: Secondary | ICD-10-CM

## 2016-09-08 NOTE — Progress Notes (Signed)
Oncology Nurse Navigator Documentation  Met with Kevin Deleon during H&N [place].  He was accompanied by his mother and step-father.  Provided verbal and written overview of [place], the clinicians who will be seeing him, encouraged him to ask questions during his time with them.  He was seen by Nutrition, SLP, PT, SW and [place].  Spoke with him at end of [facility], addressed questions. He understands I can be contacted with needs/concerns.  Gayleen Orem, RN, BSN, [place] at [place] [phone]

## 2016-09-08 NOTE — Therapy (Signed)
[place], [place], [address] Phone: [phone]   Fax:  [phone]  Physical Therapy Evaluation  Patient Details  Name: Kevin Deleon MRN: [id number] Date of Birth: 04/01/1971 Referring Provider: Dr. Eppie Gibson  Encounter Date: 09/08/2016      PT End of Session - 09/08/16 1205    Visit Number 1   Number of Visits 1   PT Start Time 0948   PT Stop Time 1010   PT Time Calculation (min) 22 min   Activity Tolerance Patient tolerated treatment well   Behavior During Therapy [name] for tasks assessed/performed      Past Medical History:  Diagnosis Date  . Chronic kidney disease    dialysis Tue-Thur-Sat  . Diabetes mellitus ([place])   . Dialysis patient ([place])    T, TH, SAT at [facility]  . GERD (gastroesophageal reflux disease)   . HTN (hypertension)    off BP meds since dialysis started 03-2016  . Hyperlipidemia   . Liver disease    patient denied 08/26/16  . Mental disorder    Schizo- affective disorder  . Schizophrenia ([place])    schizo-affective  . Smoker    quit one week ago  . Smokers' cough [facility])     Past Surgical History:  Procedure Laterality Date  . AV FISTULA PLACEMENT Left 05/24/2013   Procedure: ARTERIOVENOUS (AV) FISTULA CREATION BRACHIAL-CEPHALIC;  Surgeon: Conrad Scanlon, MD;  Location: [place];  Service: Vascular;  Laterality: Left;  . DIRECT LARYNGOSCOPY N/A 08/17/2016   Procedure: DIRECT LARYNGOSCOPY WITH BIOPSY AND FROZEN SECTION;  Surgeon: Rozetta Nunnery, MD;  Location: [place];  Service: ENT;  Laterality: N/A;  . MULTIPLE EXTRACTIONS WITH ALVEOLOPLASTY N/A 08/31/2016   Procedure: Extraction of tooth #'s 2-10, 13-15, and 19-30 with alveoloplasty;  Surgeon: Lenn Cal, DDS;  Location: [place];  Service: Oral Surgery;  Laterality: N/A;    There were no vitals filed for this visit.       Subjective Assessment - 09/08/16 1153    Subjective No  complaints today   Patient is accompained by: Family member  mother and step-father   Pertinent History Pt. presented with dysphagia and sore throat for 1 month.  Diagnosed with squamous cell carcinoma of epiglottis, p16 positive, 5 cm.; no hypermetabolic lymphnodes.  Two indeterminate subcentimeter pulmonary nodules noted. Had full mouth tooth extractions 08/31/16. Stopped smoking earlier this month.  Schizoaffective disorder, bipolar type.  DM.  Kidney disease and on dialysis 3 days a week.   Patient Stated Goals get info from all lung clinic providers   Currently in Pain? Yes   Pain Score 5    Pain Location Mouth   Pain Type Surgical pain;Acute pain   Aggravating Factors  from tooth extraction   Pain Relieving Factors ice             [facility] PT Assessment - 09/08/16 1157      Assessment   Medical Diagnosis squamous cell carcinoma of epiglottis, p16 positive   Referring Provider Dr. Eppie Gibson   Onset Date/Surgical Date 08/17/16   Hand Dominance Right   Prior Therapy none     Precautions   Precautions Other (comment)   Precaution Comments active cancer     Restrictions   Weight Bearing Restrictions No     Balance Screen   Has the patient fallen in the past 6 months No   Has the patient had a decrease in activity  level because of a fear of falling?  No   Is the patient reluctant to leave their home because of a fear of falling?  No     Home Environment   Living Environment Private residence   Living Arrangements Alone   Type of Home Apartment     Prior Function   Level of Independence Independent with basic ADLs;Independent with community mobility without device   Vocation On disability   Leisure no regular exercise     Cognition   Overall Cognitive Status Difficult to assess  mental health history     Functional Tests   Functional tests Sit to Stand     Sit to Stand   Comments 7 times in 30 seconds, well below average for age     Posture/Postural Control    Posture/Postural Control Postural limitations   Postural Limitations Forward head;Rounded Shoulders  significant forward head     ROM / Strength   AROM / PROM / Strength AROM     AROM   Overall AROM Comments neck extension 25% loss, right rotation 25% loss, others WFL; shoulder WFL bilat.     Ambulation/Gait   Ambulation/Gait Yes   Ambulation/Gait Assistance 7: Independent           LYMPHEDEMA/ONCOLOGY QUESTIONNAIRE - 09/08/16 1203      Type   Cancer Type squamous cell of epiglottis     Treatment   Active Radiation Treatment Yes   Date --  to start 09/10/16     Lymphedema Assessments   Lymphedema Assessments Head and Neck     Head and Neck   4 cm superior to sternal notch around neck 43.8 cm   6 cm superior to sternal notch around neck 44 cm   8 cm superior to sternal notch around neck 46.3 cm   Other 48.2 at 10 cm. superior to sternal notch                        PT Education - 09/08/16 1204    Education provided Yes   Education Details neck ROM, posture, breathing, walking, Cure article on staying active, lymphedema and PT info   Person(s) Educated Patient;Parent(s)   Methods Explanation;Handout   Comprehension Verbalized understanding                 Head and Neck Clinic Goals - 09/08/16 1209      Patient will be able to verbalize understanding of a home exercise program for cervical range of motion, posture, and walking.    Status Achieved     Patient will be able to verbalize understanding of proper sitting and standing posture.    Status Achieved     Patient will be able to verbalize understanding of lymphedema risk and availability of treatment for this condition.    Status Achieved           Plan - 09/08/16 1205    Clinical Impression Statement 59 yom with flat affect, h/o mental health issues (schizoaffective disorder, bipoar type), accompanied by his mother and stepfather.  He has forward head  posture, limited neck ROM, decreased performance in sit to stand; eval is moderate complexity due to comordities of kidney disease (on dialysis) and DM as well as others; evolving as his cancer treatment is jjust starting.   Rehab Potential Good   Clinical Impairments Affecting Rehab Potential possibly mental health issues   PT Frequency One time visit   PT Treatment/Interventions Patient/family  education   PT Next Visit Plan None at this time; may need therapy should lymphedema develop   PT Home Exercise Plan neck ROM, posture, breathing, walking   Consulted and Agree with Plan of Care Patient;Family member/caregiver      Patient will benefit from skilled therapeutic intervention in order to improve the following deficits and impairments:  Postural dysfunction, Decreased range of motion, Decreased activity tolerance  Visit Diagnosis: Other symptoms and signs involving the musculoskeletal system - Plan: PT plan of care cert/re-cert  Abnormal posture - Plan: PT plan of care cert/re-cert  Muscle weakness (generalized) - Plan: PT plan of care cert/re-cert      G-Codes - 33/00/76 1207/08/14    Functional Assessment Tool Used (Outpatient Only) clinical judgement   Functional Limitation Changing and maintaining body position   Changing and Maintaining Body Position Current Status (A2633) At least 1 percent but less than 20 percent impaired, limited or restricted   Changing and Maintaining Body Position Goal Status (H5456) At least 1 percent but less than 20 percent impaired, limited or restricted   Changing and Maintaining Body Position Discharge Status (Y5638) At least 1 percent but less than 20 percent impaired, limited or restricted       Problem List Patient Active Problem List   Diagnosis Date Noted  . Cancer of supraglottis ([place]) 08/24/2016  . Schizoaffective disorder, bipolar type ([place]) 02/24/2016  . End stage renal disease ([place]) 07/07/2013  . Chronic kidney disease, stage IV  (severe) ([place]) 05/05/2013  . Chronic kidney disease (CKD), stage IV (severe) ([place]) 10/11/2012  . Obesity, unspecified 08/09/2012  . Diabetes mellitus ([place]) 08/09/2012  . HTN (hypertension) 08/09/2012  . Hyperlipidemia 08/09/2012    Wendi Lastra 09/08/2016, 12:11 PM  [place], [place], [address] Phone: [phone]   Fax:  [phone]  Name: MIKI BLANK MRN: [id number] Date of Birth: March 14, 1971  Serafina Royals, PT 09/08/16 12:11 PM

## 2016-09-08 NOTE — Progress Notes (Signed)
Financial Counselor--spoke with patient today regarding [place] grant--he is going to bring in income verification once he starts treatment--gave him my card, told him to contact me if he had additional questions

## 2016-09-08 NOTE — Therapy (Signed)
[place] [address] [place], [place], [address] Phone: [phone]   Fax:  [phone]  Speech Language Pathology Evaluation  Patient Details  Name: Kevin Deleon MRN: [id number] Date of Birth: December 04, 1970 Referring Provider: Eppie Gibson MD  Encounter Date: 09/08/2016      End of Session - 09/08/16 1734    Visit Number 1   Number of Visits 3   Date for SLP Re-Evaluation 11/13/16   SLP Start Time 0835   SLP Stop Time  0915   SLP Time Calculation (min) 40 min   Activity Tolerance Patient tolerated treatment well      Past Medical History:  Diagnosis Date  . Chronic kidney disease    dialysis Tue-Thur-Sat  . Diabetes mellitus ([place])   . Dialysis patient ([place])    T, TH, SAT at [facility]  . GERD (gastroesophageal reflux disease)   . HTN (hypertension)    off BP meds since dialysis started 03-2016  . Hyperlipidemia   . Liver disease    patient denied 08/26/16  . Mental disorder    Schizo- affective disorder  . Schizophrenia ([place])    schizo-affective  . Smoker    quit one week ago  . Smokers' cough [facility])     Past Surgical History:  Procedure Laterality Date  . AV FISTULA PLACEMENT Left 05/24/2013   Procedure: ARTERIOVENOUS (AV) FISTULA CREATION BRACHIAL-CEPHALIC;  Surgeon: Conrad [place], MD;  Location: [place];  Service: Vascular;  Laterality: Left;  . DIRECT LARYNGOSCOPY N/A 08/17/2016   Procedure: DIRECT LARYNGOSCOPY WITH BIOPSY AND FROZEN SECTION;  Surgeon: Rozetta Nunnery, MD;  Location: [place];  Service: ENT;  Laterality: N/A;  . MULTIPLE EXTRACTIONS WITH ALVEOLOPLASTY N/A 08/31/2016   Procedure: Extraction of tooth #'s 2-10, 13-15, and 19-30 with alveoloplasty;  Surgeon: Lenn Cal, DDS;  Location: [place];  Service: Oral Surgery;  Laterality: N/A;    There were no vitals filed for this visit.      Subjective Assessment - 09/08/16 0858    Subjective Pt arrives  with mom and dad; denies overt s/s aspiration during meals   Currently in Pain? Yes   Pain Score 2    Pain Location Mouth   Pain Orientation Right;Left;Upper;Lower   Pain Descriptors / Indicators Sore   Pain Type Surgical pain;Acute pain   Pain Onset 1 to 4 weeks ago   Pain Frequency Constant   Aggravating Factors  nothing   Pain Relieving Factors nothing   Effect of Pain on Daily Activities harder to eat            SLP Evaluation [facility] - 09/08/16 1730      SLP Visit Information   SLP Received On 09/08/16   Referring Provider Eppie Gibson MD   Onset Date March 2018   Medical Diagnosis Epiglottic CA     Pain Assessment   Currently in Pain? Yes   Pain Score 5    Pain Location Mouth   Pain Orientation Right;Left;Upper;Lower   Pain Type Acute pain;Surgical pain   Pain Onset 1 to 4 weeks ago   Pain Frequency Constant   Pain Relieving Factors nothing   Effect of Pain on Daily Activities nothing     General Information   HPI 46 yo male with diagnosed scc of epiglottis, full mouth extractions were 08-31-16, pt with schizoaffective disorder, bipolar type. Pt is on dialysis. Begins radiation 09-14-16. PEG not planned but available if necessary.  Prior Functional Status   Cognitive/Linguistic Baseline Within functional limits   Type of Home House     Cognition   Overall Cognitive Status Within Functional Limits for tasks assessed     Auditory Comprehension   Overall Auditory Comprehension Appears within functional limits for tasks assessed     Verbal Expression   Overall Verbal Expression Appears within functional limits for tasks assessed     Oral Motor/Sensory Function   Overall Oral Motor/Sensory Function Impaired   Labial ROM Within Functional Limits   Labial Coordination WFL   Lingual ROM Within Functional Limits   Lingual Symmetry Within Functional Limits   Lingual Strength Reduced   Lingual Coordination WFL   Facial ROM Within Functional Limits   Facial  Symmetry Within Functional Limits   Overall Oral Motor/Sensory Function Pt with open mouth posture at rest, occasionally     Motor Speech   Overall Motor Speech Impaired   Articulation Impaired   Level of Impairment Phrase   Intelligibility Intelligible      Pt currently tolerates regular diet prior to tooth extraction, and thin liquids. POs: Pt ate applesauce without overt s/s aspiration. Thyroid elevation appeared adequate, and swallows appeared timely. Oral residue noted as minimal/WNL. Pt's swallow deemed WFL/WNL at this time.   Because data states the risk for dysphagia during and after radiation treatment is high due to undergoing radiation tx, SLP taught pt about the possibility of reduced/limited ability for PO intake during rad tx. SLP encouraged pt to continue swallowing POs as far into rad tx as possible, even ingesting POs and/or completing HEP shortly after administration of pain meds.   SLP educated pt re: changes to swallowing musculature after rad tx, and why adherence to dysphagia HEP provided today and PO consumption was necessary to inhibit muscular disuse atrophy and to reduce muscle fibrosis following rad tx. Pt demonstrated understanding of these things to SLP.    SLP then developed a HEP for pt and pt was instructed how to perform exercises involving lingual, vocal, and pharyngeal strengthening. SLP performed each exercise and pt return demonstrated each exercise. SLP ensured pt performance was correct prior to moving on to next exercise. Pt was instructed to complete this program 2 times a day, 6-7 days/week until 6 months after his last rad tx, then x2 a week after that.                     SLP Education - 09/08/16 1734    Education provided Yes   Education Details HEP, late effects head/neck radiation on swallowing ability   Person(s) Educated Patient;Parent(s)   Methods Explanation;Demonstration;Verbal cues;Handout   Comprehension Verbalized  understanding;Returned demonstration;Verbal cues required;Need further instruction          SLP Short Term Goals - 09/08/16 1736      SLP SHORT TERM GOAL #1   Title pt will complete HEP with occasional min A   Time 1   Period --  visit   Status New     SLP SHORT TERM GOAL #2   Title pt will tell SLP why he is completing HEP with min A   Time 1   Period --  visit   Status New     SLP SHORT TERM GOAL #3   Title pt will tell SLP 3 overt s/s aspiration PNA with modified independence   Time 1   Period --  visit   Status New  SLP Long Term Goals - 09-13-16 1738      SLP LONG TERM GOAL #1   Title pt will tell SLP why he is completing HEP with min A over two sessions   Time 2   Period --  visits   Status New     SLP LONG TERM GOAL #2   Title pt will complete HEP with min A over two sessions   Time 2   Period --  visits   Status New     SLP LONG TERM GOAL #3   Title pt will tell SLP why a food journal is helpful in returning to most liberal diet   Time 2   Period --  visits   Status New          Plan - Sep 13, 2016 1735    Clinical Impression Statement Pt with oropharyngeal swallowing essentially WNL, however the probability of swallowing difficulty increases dramatically with the initiation of radiation therapy. Pt will need to be followed by SLP for regular assessment of accurate HEP completion as well as for safety with POs both during and following treatment/s.   Speech Therapy Frequency --  approx once per 4 weeks   Duration --  2 sessions   Treatment/Interventions Aspiration precaution training;Pharyngeal strengthening exercises;Diet toleration management by SLP;Compensatory techniques;SLP instruction and feedback;Trials of upgraded texture/liquids;Patient/family education;Cueing hierarchy;Oral motor exercises;Internal/external aids  any or all may be used   Potential to Achieve Goals Fair   Potential Considerations  Co-morbidities;Cooperation/participation level   SLP Home Exercise Plan provided today   Consulted and Agree with Plan of Care Patient      Patient will benefit from skilled therapeutic intervention in order to improve the following deficits and impairments:   Dysphagia, unspecified type      G-Codes - 09-13-16 1739    Functional Assessment Tool Used NOMS, clinical judgment   Functional Limitations Swallowing   Swallow Current Status (X0383) 0 percent impaired, limited or restricted   Swallow Goal Status (F3832) At least 1 percent but less than 20 percent impaired, limited or restricted      Problem List Patient Active Problem List   Diagnosis Date Noted  . Cancer of supraglottis ([place]) 08/24/2016  . Schizoaffective disorder, bipolar type ([place]) 02/24/2016  . End stage renal disease ([place]) 07/07/2013  . Chronic kidney disease, stage IV (severe) ([place]) 05/05/2013  . Chronic kidney disease (CKD), stage IV (severe) ([place]) 10/11/2012  . Obesity, unspecified 08/09/2012  . Diabetes mellitus ([place]) 08/09/2012  . HTN (hypertension) 08/09/2012  . Hyperlipidemia 08/09/2012    [facility] ,[place], [place]  2016-09-13, 5:40 PM  [place] [address] [place], [place], [address] Phone: [phone]   Fax:  [phone]  Name: Kevin Deleon MRN: [id number] Date of Birth: 09/05/1970

## 2016-09-08 NOTE — Progress Notes (Signed)
Patient was seen during [facility].  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  Past medical history includes smoking, schizophrenia, mental disorder, liver disease, hyperlipidemia, hypertension, GERD, diabetes, dialysis and chronic kidney disease.  Medications include Glucotrol, Lantus, renal vitamin, and Zocor.  Labs include sodium 132, potassium 4.3, glucose 140  Height: 5 feet 11 inches. Weight: 231 pounds. Usual body weight: 230 pounds. BMI: 32.22.  Patient is receiving radiation therapy.  He will not receive chemotherapy secondary to dialysis 3 days a week. He begins radiation therapy on April 30. He is status post extractions on April 16. Patient currently lives alone but has supportive parents. Patient reports he does not want feeding tube. Patient's mother purchased Nepro for patient as an oral nutrition supplement.  He has not tried it yet. Patient does not follow a special diet of any kind and does not limit fluids.  Nutrition diagnosis:  Food and nutrition related knowledge deficit related to epiglottitis cancer and associated treatments as evidenced by no prior need for nutrition related information.  Intervention: Educated patient to continue strategies for adequate calories and protein to minimize weight loss throughout treatment. Provided fact sheet on calories and protein. Recommended patient try Nepro and consume once daily. Education provided, fact sheets given, questions were answered.  Teach back method used.  Monitoring, evaluation, goals: Patient will tolerate adequate calories and protein to minimize weight loss.  Next visit: To be schedule weekly.  **Disclaimer: This note was dictated with voice recognition software. Similar sounding words can inadvertently be transcribed and this note may contain transcription errors which may not have been corrected upon publication of note.**

## 2016-09-08 NOTE — Telephone Encounter (Signed)
Patients mother is calling - she said they have an appointment Monday. She wanted you to know that he will be starting radiation treatment for throat cancer. She is concerned about him taking medication, the radiation will make it hard to swallow. She would like to discuss the possibility of injections. She just wanted to let you know ahead of time.

## 2016-09-08 NOTE — Patient Instructions (Addendum)
SWALLOWING EXERCISES Do once a day until your radiation starts, then do as directed  6 of the 7 days per week until 6 months after your last day of radiatiion, then 2 times per week afterwards  1. Effortful Swallows - Press your tongue against the roof of your mouth for 3 seconds, then squeeze          the muscles in your neck while you swallow your saliva or a sip of water - Repeat 20 times, 2-3 times a day, and use whenever you eat or drink  2. Masako Swallow - swallow with your tongue sticking out - Stick tongue out past your teeth and gently bite tongue with your teeth - Swallow, while holding your tongue with your teeth - Repeat 20 times, 2-3 times a day *use a wet spoon if your mouth gets dry*  3. Shaker Exercise - head lift - Lie flat on your back in your bed or on a couch without pillows - Raise your head and look at your feet - KEEP YOUR SHOULDERS DOWN - HOLD FOR 45-60 SECONDS, then lower your head back down - Repeat 3 times, 2-3 times a day  4. Mendelsohn Maneuver - "half swallow" exercise - Start to swallow, and keep your Adam's apple up by squeezing hard with the            muscles of the throat - Hold the squeeze for 5-7 seconds and then relax - Repeat 20 times, 2-3 times a day *use a wet spoon if your mouth gets dry*  5. Tongue Stretch/Teeth Clean - Move your tongue around the pocket between your gums and teeth, clockwise and then counter-clockwise - Repeat on the back side, clockwise and then counter-clockwise - Repeat 15-20 times, 2-3 times a day  6. Breath Hold - Say "HUH!" loudly, then hold your breath for 3 seconds at your voice box - Repeat 20 times, 2-3 times a day  7. Chin pushback - Open your mouth  - Place your fist UNDER your chin near your neck, and push back with your fist for 5 seconds - Repeat 10 times, 2-3 times a day

## 2016-09-08 NOTE — Progress Notes (Addendum)
[facility] Clinical Social Work  Clinical Social Work met with patient/family at head & neck multidisciplinary clinic to check in.  CSW familiar with patient and family.  Patient's mother provided guardian documents to be scanned into patient's chart.  Mr. Pelfrey was deemed incompetent and patient's mother serves as appointed guardian for decision making.  CSW will send to medical records to be scanned into patient's chart.  Clinical Social Work briefly discussed Clinical Social Work role and [employer] support programs/services.  Clinical Social Work encouraged patient to call with any additional questions or concerns.   Maryjean Morn, MSW, LCSW, OSW-C Clinical Social Worker [facility] [phone]

## 2016-09-09 ENCOUNTER — Ambulatory Visit (HOSPITAL_COMMUNITY): Payer: Self-pay | Admitting: Dentistry

## 2016-09-09 DIAGNOSIS — Z510 Encounter for antineoplastic radiation therapy: Secondary | ICD-10-CM | POA: Diagnosis not present

## 2016-09-11 ENCOUNTER — Encounter (HOSPITAL_COMMUNITY): Payer: Self-pay | Admitting: Dentistry

## 2016-09-11 ENCOUNTER — Ambulatory Visit (HOSPITAL_COMMUNITY): Payer: Medicaid - Dental | Admitting: Dentistry

## 2016-09-11 VITALS — BP 107/59 | HR 81 | Temp 97.9°F

## 2016-09-11 DIAGNOSIS — K08199 Complete loss of teeth due to other specified cause, unspecified class: Secondary | ICD-10-CM

## 2016-09-11 DIAGNOSIS — Z01818 Encounter for other preprocedural examination: Secondary | ICD-10-CM

## 2016-09-11 DIAGNOSIS — K08109 Complete loss of teeth, unspecified cause, unspecified class: Secondary | ICD-10-CM

## 2016-09-11 DIAGNOSIS — C321 Malignant neoplasm of supraglottis: Secondary | ICD-10-CM

## 2016-09-11 NOTE — Progress Notes (Signed)
POST OPERATIVE NOTE:  09/11/2016 Kevin Deleon [id number]  VITALS: BP (!) 107/59 (BP Location: Right Arm)   Pulse 81   Temp 97.9 F (36.6 C) (Oral)   LABS:  Lab Results  Component Value Date   WBC 10.9 (H) 08/26/2016   HGB 13.3 08/31/2016   HCT 39.0 08/31/2016   MCV 97.8 08/26/2016   PLT 244 08/26/2016   BMET    Component Value Date/Time   NA 132 (L) 08/31/2016 0641   K 4.3 08/31/2016 0641   CL 95 (L) 08/26/2016 0937   CO2 28 08/26/2016 0937   GLUCOSE 140 (H) 08/31/2016 0641   BUN 25 (H) 08/26/2016 0937   CREATININE 5.51 (H) 08/26/2016 0937   CALCIUM 9.6 08/26/2016 0937   GFRNONAA 11 (L) 08/26/2016 0937   GFRAA 13 (L) 08/26/2016 0937    No results found for: INR, PROTIME No results found for: PTT   Kevin Deleon is status post extraction of remaining teeth with alveoloplasty in the operating room on 08/31/2016. The patient now presents for evaluation of healing and suture removal.  SUBJECTIVE: Patient with minimal discomfort. Patient indicates that he has stitches that remain.  EXAM: There is no sign of infection, heme, or ooze. Patient is healing in by generalized primary closure. Sutures are loosely intact. Patient is now edentulous. There is atrophy of the edentulous alveolar ridges.  PROCEDURE: The patient was given a chlorhexidine gluconate rinse for 30 seconds. Sutures were then removed without complication. Patient tolerated the procedure well.  ASSESSMENT: Post operative course is consistent with dental procedures performed in the operating room. The patient is edentulous. There is atrophy of the edentulous alveolar ridges.  PLAN: 1. Continue salt water rinses as needed to aid healing. 2. Brush tongue daily. 3. Advance diet as tolerated. 4. Follow-up with a dentist of his choice for fabrication of upper and lower complete dentures starting approximately 3 months after the last radiation therapy has been provided. 5. Patient is currently cleared  for radiation therapy.   Lenn Cal, DDS

## 2016-09-11 NOTE — Patient Instructions (Addendum)
PLAN: 1. Continue salt water rinses as needed to aid healing. 2. Brush tongue daily. 3. Advance diet as tolerated. 4. Follow-up with a dentist of his choice for fabrication of upper and lower complete dentures starting approximately 3 months after the last radiation therapy has been provided. 5. Patient is currently cleared for radiation therapy.   Lenn Cal, DDS

## 2016-09-14 ENCOUNTER — Other Ambulatory Visit (HOSPITAL_COMMUNITY): Payer: Self-pay

## 2016-09-14 ENCOUNTER — Encounter (HOSPITAL_COMMUNITY): Payer: Self-pay | Admitting: Psychiatry

## 2016-09-14 ENCOUNTER — Ambulatory Visit
Admission: RE | Admit: 2016-09-14 | Discharge: 2016-09-14 | Disposition: A | Discharge status: Home / Self Care | Payer: Medicare Other | Source: Ambulatory Visit | Attending: Radiation Oncology | Admitting: Radiation Oncology

## 2016-09-14 ENCOUNTER — Ambulatory Visit (INDEPENDENT_AMBULATORY_CARE_PROVIDER_SITE_OTHER): Payer: 59 | Admitting: Psychiatry

## 2016-09-14 VITALS — BP 128/74 | HR 91 | Ht 71.0 in | Wt 228.4 lb

## 2016-09-14 DIAGNOSIS — F250 Schizoaffective disorder, bipolar type: Secondary | ICD-10-CM

## 2016-09-14 DIAGNOSIS — Z79899 Other long term (current) drug therapy: Secondary | ICD-10-CM | POA: Diagnosis not present

## 2016-09-14 DIAGNOSIS — Z79891 Long term (current) use of opiate analgesic: Secondary | ICD-10-CM

## 2016-09-14 DIAGNOSIS — Z87891 Personal history of nicotine dependence: Secondary | ICD-10-CM | POA: Diagnosis not present

## 2016-09-14 DIAGNOSIS — Z510 Encounter for antineoplastic radiation therapy: Secondary | ICD-10-CM | POA: Diagnosis not present

## 2016-09-14 MED ORDER — RISPERIDONE 1 MG/ML PO SOLN: 2.0000 mg | Freq: Every day | ORAL | 2 refills | Status: DC

## 2016-09-14 MED ORDER — RISPERIDONE 1 MG/ML PO SOLN: 2.0000 mg | Freq: Every day | ORAL | 0 refills | Status: DC

## 2016-09-14 NOTE — Progress Notes (Signed)
[facility] MD/PA/NP OP Progress Note  09/14/2016 10:16 AM Kevin Deleon  MRN:  [id number]  Chief Complaint:  Subjective:  I'm doing good.  I will need radiation for my cancer.  HPI: Kevin Deleon came for his follow-up appointment with his mother.  Patient reported things are going very well however his mother mentioned that he is recently diagnosed with throat cancer and he will start radiation treatment today.  Mother has power of attorney to make decision.  Patient is taking Risperdal tablet and trazodone which was given as patient was feeling more restless and having insomnia.  Patient mentioned trazodone working very well for him as he sleeping much better.  He denies any agitation, irritability, paranoia or any hallucination.  He continues to do dialysis on Tuesday, Thursday and Saturday.  He admitted some time gets very tired after dialysis.  However he feels medicine working very well.  Mother's concern that once he start the radiation he may not able to take the pill and wondering if he can be on Risperdal injection.  She does not want to change the medication since it is working very well.  He is happy because he can take his own scooter dialysis and does not have to wait for transportation.  Patient denies drinking alcohol or using any illegal substances.  He denies any recent outbursts or any anger issues.  His appetite is okay.  His vital signs are stable.  Visit Diagnosis:    ICD-9-CM ICD-10-CM   1. Schizoaffective disorder, bipolar type ([place]) 295.70 F25.0 risperiDONE (RISPERDAL) 1 MG/ML oral solution    Past Psychiatric History: Reviewed. Patient has psychiatric illness since age 46. He has paranoia, mania, psychosis and disorganized thinking. He has at least 5 psychiatric hospitalization mostly due to noncompliance with medication. He was seeing [facility] health for many years. In the past he had tried Haldol which he actually likes but when the dose was increased to 15 mg he became  zombie and stopped. He has taken lithium in the past this was stopped because of renal insufficiency. He was also given Amitriptylineby Dr. Milagros Loll.  Past Medical History:  Past Medical History:  Diagnosis Date  . Chronic kidney disease    dialysis Tue-Thur-Sat  . Diabetes mellitus ([place])   . Dialysis patient ([place])    T, TH, SAT at [facility]  . GERD (gastroesophageal reflux disease)   . HTN (hypertension)    off BP meds since dialysis started 03-2016  . Hyperlipidemia   . Liver disease    patient denied 08/26/16  . Mental disorder    Schizo- affective disorder  . Schizophrenia ([place])    schizo-affective  . Smoker    quit one week ago  . Smokers' cough [facility])     Past Surgical History:  Procedure Laterality Date  . AV FISTULA PLACEMENT Left 05/24/2013   Procedure: ARTERIOVENOUS (AV) FISTULA CREATION BRACHIAL-CEPHALIC;  Surgeon: Conrad [place], MD;  Location: [facility];  Service: Vascular;  Laterality: Left;  . DIRECT LARYNGOSCOPY N/A 08/17/2016   Procedure: DIRECT LARYNGOSCOPY WITH BIOPSY AND FROZEN SECTION;  Surgeon: Rozetta Nunnery, MD;  Location: [facility];  Service: ENT;  Laterality: N/A;  . MULTIPLE EXTRACTIONS WITH ALVEOLOPLASTY N/A 08/31/2016   Procedure: Extraction of tooth #'s 2-10, 13-15, and 19-30 with alveoloplasty;  Surgeon: Lenn Cal, DDS;  Location: [facility];  Service: Oral Surgery;  Laterality: N/A;    Family Psychiatric History: Reviewed.  Family History:  Family History  Problem  Relation Age of Onset  . Cancer - Lung Other   . Cancer Other     Social History:  Social History   Social History  . Marital status: Single    Spouse name: N/A  . Number of children: N/A  . Years of education: N/A   Social History Main Topics  . Smoking status: Former Smoker    Packs/day: 1.00    Years: 20.00    Types: Cigarettes    Quit date: 08/12/2016  . Smokeless tobacco: Never Used  . Alcohol use No     Comment: occ  . Drug use: No  .  Sexual activity: Not on file   Other Topics Concern  . Not on file   Social History Narrative  . No narrative on file    Allergies: No Known Allergies  Metabolic Disorder Labs: Lab Results  Component Value Date   HGBA1C 7.1 (H) 08/26/2016   MPG 157 08/26/2016   No results found for: PROLACTIN No results found for: CHOL, TRIG, HDL, CHOLHDL, VLDL, LDLCALC   Current Medications: Current Outpatient Prescriptions  Medication Sig Dispense Refill  . glipiZIDE (GLUCOTROL) 5 MG tablet Take 5 mg by mouth daily at 12 noon.     Marland Kitchen LANTUS SOLOSTAR 100 UNIT/ML injection Inject 28 Units into the skin at bedtime.     . multivitamin (RENA-VIT) TABS tablet Take 1 tablet by mouth daily.    . nicotine (NICODERM CQ - DOSED IN MG/24 HOURS) 21 mg/24hr patch Place 21 mg onto the skin daily.    . ONE TOUCH ULTRA TEST test strip 1 each by Other route every evening.     Marland Kitchen oxyCODONE-acetaminophen (PERCOCET) 5-325 MG tablet Take one or two tablets by mouth every 6 hours as needed for pain. 40 tablet 0  . risperiDONE (RISPERDAL) 2 MG tablet Take 1 tablet by mouth at  bedtime (Patient taking differently: Take 2 mg by mouth daily at 12 noon. ) 90 tablet 0  . simvastatin (ZOCOR) 20 MG tablet Take 20 mg by mouth daily at 12 noon.     . traZODone (DESYREL) 50 MG tablet Take 1 - 2 tablets at bedtime as needed for sleep 180 tablet 0   No current facility-administered medications for this visit.     Neurologic: Headache: No Seizure: No Paresthesias: No  Musculoskeletal: Strength & Muscle Tone: within normal limits Gait & Station: normal Patient leans: N/A  Psychiatric Specialty Exam: ROS  Blood pressure 128/74, pulse 91, height 5\' 11"  (1.803 m), weight 228 lb 6.4 oz (103.6 kg).Body mass index is 31.86 kg/m.  General Appearance: Casual  Eye Contact:  Good  Speech:  Slow  Volume:  Normal  Mood:  Euthymic  Affect:  Congruent  Thought Process:  Goal Directed  Orientation:  Full (Time, Place, and  Person)  Thought Content: WDL and Logical   Suicidal Thoughts:  No  Homicidal Thoughts:  No  Memory:  Immediate;   Fair Recent;   Fair Remote;   Fair  Judgement:  Good  Insight:  Good  Psychomotor Activity:  Normal  Concentration:  Concentration: Fair and Attention Span: Fair  Recall:  [employer] of Knowledge: Fair  Language: Fair  Akathisia:  No  Handed:  Right  AIMS (if indicated):  0  Assets:  Communication Skills Desire for Improvement Housing Social Support  ADL's:  Intact  Cognition: WNL  Sleep:  Adequate     Assessment: Schizophrenia chronic paranoid type  Plan: I had a long discussion  with the patient and his mother about his medication.  Both of them does not want to change the medication since it is working very well.  Patient will go for radiation treatment for his throat cancer starting today.  I recommended to try liquid Risperdal and he can crush trazodone to take it bedtime with liquid.  However if liquid Risperdal did not work than we will consider Risperdal Consta injection.  Patient and his mother agree with the plan.  We will continue Risperdal 2 mg in liquid form at bedtime and trazodone as needed for insomnia.  Discussed medication side effects and benefits.  Patient has no tremors, shakes, EPS or any concerns.  Patient like to come back in 4 weeks to see the efficacy of liquid Risperdal.  I recommended to call us back if he has any question or any concern.  Follow-up in 4 weeks.  Discuss safety plan that anytime having active suicidal thoughts or homicidal thoughts and he need to call 911 or go to local emergency room.    Ardena Gangl T., MD 09/14/2016, 10:16 AM

## 2016-09-15 ENCOUNTER — Ambulatory Visit
Admission: RE | Admit: 2016-09-15 | Discharge: 2016-09-15 | Disposition: A | Discharge status: Home / Self Care | Payer: Medicare Other | Source: Ambulatory Visit | Attending: Radiation Oncology | Admitting: Radiation Oncology

## 2016-09-15 ENCOUNTER — Ambulatory Visit: Payer: Medicare Other

## 2016-09-15 DIAGNOSIS — Z510 Encounter for antineoplastic radiation therapy: Secondary | ICD-10-CM | POA: Diagnosis not present

## 2016-09-16 ENCOUNTER — Ambulatory Visit
Admission: RE | Admit: 2016-09-16 | Discharge: 2016-09-16 | Disposition: A | Discharge status: Home / Self Care | Payer: Medicare Other | Source: Ambulatory Visit | Attending: Radiation Oncology | Admitting: Radiation Oncology

## 2016-09-16 DIAGNOSIS — Z510 Encounter for antineoplastic radiation therapy: Secondary | ICD-10-CM | POA: Diagnosis not present

## 2016-09-17 ENCOUNTER — Ambulatory Visit
Admission: RE | Admit: 2016-09-17 | Discharge: 2016-09-17 | Disposition: A | Discharge status: Home / Self Care | Payer: Medicare Other | Source: Ambulatory Visit | Attending: Radiation Oncology | Admitting: Radiation Oncology

## 2016-09-17 ENCOUNTER — Encounter: Payer: Self-pay | Admitting: Nutrition

## 2016-09-17 DIAGNOSIS — Z510 Encounter for antineoplastic radiation therapy: Secondary | ICD-10-CM | POA: Diagnosis not present

## 2016-09-18 ENCOUNTER — Ambulatory Visit
Admission: RE | Admit: 2016-09-18 | Discharge: 2016-09-18 | Disposition: A | Discharge status: Home / Self Care | Payer: Medicare Other | Source: Ambulatory Visit | Attending: Radiation Oncology | Admitting: Radiation Oncology

## 2016-09-18 DIAGNOSIS — Z510 Encounter for antineoplastic radiation therapy: Secondary | ICD-10-CM | POA: Diagnosis not present

## 2016-09-21 ENCOUNTER — Ambulatory Visit
Admission: RE | Admit: 2016-09-21 | Discharge: 2016-09-21 | Disposition: A | Discharge status: Home / Self Care | Payer: Medicare Other | Source: Ambulatory Visit | Attending: Radiation Oncology | Admitting: Radiation Oncology

## 2016-09-21 DIAGNOSIS — Z510 Encounter for antineoplastic radiation therapy: Secondary | ICD-10-CM | POA: Diagnosis not present

## 2016-09-22 ENCOUNTER — Ambulatory Visit
Admission: RE | Admit: 2016-09-22 | Discharge: 2016-09-22 | Disposition: A | Discharge status: Home / Self Care | Payer: Medicare Other | Source: Ambulatory Visit | Attending: Radiation Oncology | Admitting: Radiation Oncology

## 2016-09-22 ENCOUNTER — Ambulatory Visit: Payer: Medicare Other

## 2016-09-22 DIAGNOSIS — Z510 Encounter for antineoplastic radiation therapy: Secondary | ICD-10-CM | POA: Diagnosis not present

## 2016-09-22 NOTE — Progress Notes (Signed)
Nutrition Follow-up:  Nutrition follow-up completed following radiation this am. Mother, Horris Latino and step-father Jevonte were with patient this am during clinic visit.  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   Mother and patient report appetite is good and eating well.  Has been eating oatmeal, broccoli and cheddar soups, pudding, yogurt, mashed potatoes and gravy.  Mother reports that he has been drinking nepro (usually during dialysis) 1-2 times per day.  Mother reports she also has boost on hand if patient needs them as well.    Mother reports he has dialysis on Tuesday and Thursday.   Medications: reviewed  Labs: reviewed  Anthropometrics:   Mother reports patient was weighed yesterday (5/7) at radiation and was 231 lb, stable weight since nutrition assessment on 4/24.   NUTRITION DIAGNOSIS: Food and nutrition related knowledge deficit continues   INTERVENTION:   Encouraged continued intake of high calorie, protein foods. Encouraged continued use of nepro oral nutrition supplements.  Mother reports has not tried boost samples yet but still has them.    MONITORING, EVALUATION, GOAL: Patient will tolerate adequate calories and protein to minimize weight loss.   NEXT VISIT: May 15 after radiation   Kamari Bilek B. Zenia Resides, [place], [place] Registered Dietitian [phone] (pager)

## 2016-09-23 ENCOUNTER — Ambulatory Visit
Admission: RE | Admit: 2016-09-23 | Discharge: 2016-09-23 | Disposition: A | Discharge status: Home / Self Care | Payer: Medicare Other | Source: Ambulatory Visit | Attending: Radiation Oncology | Admitting: Radiation Oncology

## 2016-09-23 DIAGNOSIS — Z510 Encounter for antineoplastic radiation therapy: Secondary | ICD-10-CM | POA: Diagnosis not present

## 2016-09-24 ENCOUNTER — Ambulatory Visit
Admission: RE | Admit: 2016-09-24 | Discharge: 2016-09-24 | Disposition: A | Discharge status: Home / Self Care | Payer: Medicare Other | Source: Ambulatory Visit | Attending: Radiation Oncology | Admitting: Radiation Oncology

## 2016-09-24 DIAGNOSIS — Z510 Encounter for antineoplastic radiation therapy: Secondary | ICD-10-CM | POA: Diagnosis not present

## 2016-09-25 ENCOUNTER — Ambulatory Visit
Admission: RE | Admit: 2016-09-25 | Discharge: 2016-09-25 | Disposition: A | Discharge status: Home / Self Care | Payer: Medicare Other | Source: Ambulatory Visit | Attending: Radiation Oncology | Admitting: Radiation Oncology

## 2016-09-25 DIAGNOSIS — Z510 Encounter for antineoplastic radiation therapy: Secondary | ICD-10-CM | POA: Diagnosis not present

## 2016-09-28 ENCOUNTER — Other Ambulatory Visit: Payer: Self-pay | Admitting: Radiation Oncology

## 2016-09-28 ENCOUNTER — Ambulatory Visit
Admission: RE | Admit: 2016-09-28 | Discharge: 2016-09-28 | Disposition: A | Discharge status: Home / Self Care | Payer: Medicare Other | Source: Ambulatory Visit | Attending: Radiation Oncology | Admitting: Radiation Oncology

## 2016-09-28 DIAGNOSIS — Z510 Encounter for antineoplastic radiation therapy: Secondary | ICD-10-CM | POA: Diagnosis not present

## 2016-09-28 DIAGNOSIS — C321 Malignant neoplasm of supraglottis: Secondary | ICD-10-CM

## 2016-09-28 MED ORDER — MAGIC MOUTHWASH W/LIDOCAINE: ORAL | 2 refills | Status: DC

## 2016-09-28 MED FILL — MAGIC MW W/LIDO 1:1:1:3: 12 days supply | Qty: 480 | Fill #0

## 2016-09-29 ENCOUNTER — Ambulatory Visit
Admission: RE | Admit: 2016-09-29 | Discharge: 2016-09-29 | Disposition: A | Discharge status: Home / Self Care | Payer: Medicare Other | Source: Ambulatory Visit | Attending: Radiation Oncology | Admitting: Radiation Oncology

## 2016-09-29 ENCOUNTER — Ambulatory Visit: Payer: Medicare Other

## 2016-09-29 DIAGNOSIS — Z510 Encounter for antineoplastic radiation therapy: Secondary | ICD-10-CM | POA: Diagnosis not present

## 2016-09-29 NOTE — Progress Notes (Signed)
Nutrition Follow-up:  Nutrition follow-up completed with patient, mother and step-father.  Patient has epiglottic cancer and is P16 positive.  Mother reports patient is more fatigue.  Patient reports some sore throat, was given prescription for magic mouthwash with lidocaine yesterday per mother.  Patient lives alone and reports ate mashed potatoes and drank 4 boost glucose control supplements yesterday.  Reports that he usually drink 2 nepro shakes on dialysis days (Tuesday, Thursday, Saturday)   Medications: magic mouthwash with lidocaine  Labs: glucose 175, K WNL (4/16)  Anthropometrics:   Mother reports weight has decreased to 228 lb decreased (taken in radiation), from 231 lb on 5/8.    Estimated Energy Needs  Kcals: 0623-7628 calories/d (22-25 kcals/kg) Protein: 110-125 g/d Fluid: 1526ml  + UOP  NUTRITION DIAGNOSIS: Food and nutrition related knowledge deficit improved Inadequate oral intake related to cancer and cancer related treatments as evidenced by 1% weight loss in 1 week.     INTERVENTION:   Encouraged continued intake of oral nutrition supplements boost glucose control and nepro for additional calories and protein.  May need to switch to higher calorie boost/ensure supplement if weight loss continues. Encouraged intake of smooth, liquid foods that are high in calories and protein.     MONITORING, EVALUATION, GOAL: Patient will consume adequate calories and protein to meet minimize weight loss   NEXT VISIT: May 22 after radiation  Jearlean Demauro B. Zenia Resides, [place], [place] Registered Dietitian [phone] (pager)

## 2016-09-30 ENCOUNTER — Ambulatory Visit
Admission: RE | Admit: 2016-09-30 | Discharge: 2016-09-30 | Disposition: A | Discharge status: Home / Self Care | Payer: Medicare Other | Source: Ambulatory Visit | Attending: Radiation Oncology | Admitting: Radiation Oncology

## 2016-09-30 ENCOUNTER — Other Ambulatory Visit (HOSPITAL_COMMUNITY): Payer: Self-pay

## 2016-09-30 DIAGNOSIS — Z510 Encounter for antineoplastic radiation therapy: Secondary | ICD-10-CM | POA: Diagnosis not present

## 2016-09-30 DIAGNOSIS — F250 Schizoaffective disorder, bipolar type: Secondary | ICD-10-CM

## 2016-09-30 MED ORDER — RISPERIDONE 1 MG/ML PO SOLN: 2.0000 mg | Freq: Every day | ORAL | 0 refills | Status: DC

## 2016-09-30 NOTE — Progress Notes (Signed)
Pharmacy called to confirm the change to liquid Risperidone and asked that patient get a 90 day because it is cheaper. Per Dr. Marguerite Olea note it is supposed to be liquid and I approved 90 day

## 2016-10-01 ENCOUNTER — Ambulatory Visit
Admission: RE | Admit: 2016-10-01 | Discharge: 2016-10-01 | Disposition: A | Discharge status: Home / Self Care | Payer: Medicare Other | Source: Ambulatory Visit | Attending: Radiation Oncology | Admitting: Radiation Oncology

## 2016-10-01 DIAGNOSIS — Z510 Encounter for antineoplastic radiation therapy: Secondary | ICD-10-CM | POA: Diagnosis not present

## 2016-10-02 ENCOUNTER — Ambulatory Visit
Admission: RE | Admit: 2016-10-02 | Discharge: 2016-10-02 | Disposition: A | Discharge status: Home / Self Care | Payer: Medicare Other | Source: Ambulatory Visit | Attending: Radiation Oncology | Admitting: Radiation Oncology

## 2016-10-02 DIAGNOSIS — Z510 Encounter for antineoplastic radiation therapy: Secondary | ICD-10-CM | POA: Diagnosis not present

## 2016-10-05 ENCOUNTER — Encounter: Payer: Self-pay | Admitting: Radiation Oncology

## 2016-10-05 ENCOUNTER — Ambulatory Visit
Admission: RE | Admit: 2016-10-05 | Discharge: 2016-10-05 | Disposition: A | Discharge status: Home / Self Care | Payer: Medicare Other | Source: Ambulatory Visit | Attending: Radiation Oncology | Admitting: Radiation Oncology

## 2016-10-05 ENCOUNTER — Ambulatory Visit: Payer: Medicare Other | Attending: Radiation Oncology

## 2016-10-05 VITALS — BP 132/91 | HR 84 | Temp 98.4°F | Ht 71.0 in | Wt 228.6 lb

## 2016-10-05 DIAGNOSIS — R1310 Dysphagia, unspecified: Secondary | ICD-10-CM | POA: Insufficient documentation

## 2016-10-05 DIAGNOSIS — C321 Malignant neoplasm of supraglottis: Secondary | ICD-10-CM

## 2016-10-05 DIAGNOSIS — Z510 Encounter for antineoplastic radiation therapy: Secondary | ICD-10-CM | POA: Diagnosis not present

## 2016-10-05 NOTE — Progress Notes (Signed)
Kevin Deleon presents for his 16th fraction of radiation to his Larynx. He denies pain. He reports some fatigue. He is eating softer foods like oatmeal. He is also drinking 3-4 cans of ensure daily. He is drinking very little water, and I encouraged him to increase his water intake. He is drinking pepsi. His neck is slightly red, and he is using biafine cream twice daily.  BP (!) 132/91   Pulse 84   Temp 98.4 F (36.9 C)   Ht 5\' 11"  (1.803 m)   Wt 228 lb 9.6 oz (103.7 kg)   SpO2 100% Comment: room air  BMI 31.88 kg/m    09/29/16 228.8 lb 10/05/16 228.6 lb

## 2016-10-05 NOTE — Patient Instructions (Signed)
When you swallow liquids, single sips may work better when you progress further into radiation treatment.

## 2016-10-05 NOTE — Progress Notes (Signed)
   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  CBCT/MVCT images/Port film x-rays were reviewed.  The chart was checked. Mild pain in throat. Doing well overall, weight stable  Physical Findings:  Wt Readings from Last 3 Encounters:  10/05/16 228 lb 9.6 oz (103.7 kg)  09/08/16 231 lb (104.8 kg)  08/28/16 226 lb 12.8 oz (102.9 kg)    height is 5\' 11"  (1.803 m) and weight is 228 lb 9.6 oz (103.7 kg). His temperature is 98.4 F (36.9 C). His blood pressure is 132/91 (abnormal) and his pulse is 84. His oxygen saturation is 100%.    Skin intact over neck, ambulatory, NAD CBC    Component Value Date/Time   WBC 10.9 (H) 08/26/2016 0937   RBC 4.56 08/26/2016 0937   HGB 13.3 08/31/2016 0641   HCT 39.0 08/31/2016 0641   PLT 244 08/26/2016 0937   MCV 97.8 08/26/2016 0937   MCH 31.8 08/26/2016 0937   MCHC 32.5 08/26/2016 0937   RDW 16.5 (H) 08/26/2016 0937     CMP     Component Value Date/Time   NA 132 (L) 08/31/2016 0641   K 4.3 08/31/2016 0641   CL 95 (L) 08/26/2016 0937   CO2 28 08/26/2016 0937   GLUCOSE 140 (H) 08/31/2016 0641   BUN 25 (H) 08/26/2016 0937   CREATININE 5.51 (H) 08/26/2016 0937   CALCIUM 9.6 08/26/2016 0937   PROT 7.1 08/26/2016 0937   ALBUMIN 3.7 08/26/2016 0937   AST 15 08/26/2016 0937   ALT 9 (L) 08/26/2016 0937   ALKPHOS 88 08/26/2016 0937   BILITOT 0.4 08/26/2016 0937   GFRNONAA 11 (L) 08/26/2016 0937   GFRAA 13 (L) 08/26/2016 2423     Impression:  The patient is tolerating radiotherapy.   Plan:  Continue radiotherapy as planned. Drinks little, Encouraged to drink more water.  -----------------------------------  Eppie Gibson, MD

## 2016-10-05 NOTE — Therapy (Signed)
[facility] [address] [place], [place], [address] Phone: [phone]   Fax:  [phone]  Speech Language Pathology Treatment  Patient Details  Name: Kevin Deleon MRN: [id number] Date of Birth: 24-Dec-1970 Referring Provider: Eppie Gibson MD  Encounter Date: 10/05/2016      End of Session - 10/05/16 0845    Visit Number 2   Number of Visits 3   Date for SLP Re-Evaluation 11/13/16  pt will need renewal for his 11-16-16 visit   SLP Start Time 0805   SLP Stop Time  0835   SLP Time Calculation (min) 30 min   Activity Tolerance Patient tolerated treatment well      Past Medical History:  Diagnosis Date  . Chronic kidney disease    dialysis Tue-Thur-Sat  . Diabetes mellitus ([place])   . Dialysis patient ([place])    T, TH, SAT at [facility]  . GERD (gastroesophageal reflux disease)   . HTN (hypertension)    off BP meds since dialysis started 03-2016  . Hyperlipidemia   . Liver disease    patient denied 08/26/16  . Mental disorder    Schizo- affective disorder  . Schizophrenia ([place])    schizo-affective  . Smoker    quit one week ago  . Smokers' cough [facility])     Past Surgical History:  Procedure Laterality Date  . AV FISTULA PLACEMENT Left 05/24/2013   Procedure: ARTERIOVENOUS (AV) FISTULA CREATION BRACHIAL-CEPHALIC;  Surgeon: Conrad Esparto, MD;  Location: [place];  Service: Vascular;  Laterality: Left;  . DIRECT LARYNGOSCOPY N/A 08/17/2016   Procedure: DIRECT LARYNGOSCOPY WITH BIOPSY AND FROZEN SECTION;  Surgeon: Rozetta Nunnery, MD;  Location: [place];  Service: ENT;  Laterality: N/A;  . MULTIPLE EXTRACTIONS WITH ALVEOLOPLASTY N/A 08/31/2016   Procedure: Extraction of tooth #'s 2-10, 13-15, and 19-30 with alveoloplasty;  Surgeon: Lenn Cal, DDS;  Location: [place];  Service: Oral Surgery;  Laterality: N/A;    There were no vitals filed for this visit.      Subjective Assessment  - 10/05/16 0811    Subjective Pt arrives with mom and dad; Soft foods predominate, ageusia beginning.                ADULT SLP TREATMENT - 10/05/16 0812      General Information   Behavior/Cognition Alert;Cooperative;Lethargic  flat affect     Treatment Provided   Treatment provided Dysphagia     Dysphagia Treatment   Temperature Spikes Noted No   Treatment Methods Skilled observation;Therapeutic exercise;Patient/caregiver education   Patient observed directly with PO's Yes   Type of PO's observed Dysphagia 1 (puree);Thin liquids   Liquids provided via Cup   Oral Phase Signs & Symptoms --  none noted   Pharyngeal Phase Signs & Symptoms --  none noted   Other treatment/comments One incidence last week of coughing with oatmeal. With POs today there were not overt s/s aspiration. Pt has done HEP "every now and again." SLP reviewed exercises with pt and he req'd     Pain Assessment   Pain Assessment No/denies pain     Assessment / Recommendations / Plan   Plan Continue with current plan of care     Dysphagia Recommendations   Diet recommendations --  as tolerated   Medication Administration Whole meds with puree  as tolerated   Supervision Intermittent supervision to cue for compensatory strategies   Compensations --  single sips as rad  tx progresses     Progression Toward Goals   Progression toward goals Not progressing toward goals (comment)  pt has not been compliant with HEP          SLP Education - 10/05/16 0844    Education provided Yes   Education Details HEP, late effects of head/neck radiation on swallwoing function, single sips with liquids instead of multiple consecutive sips   Person(s) Educated Patient;Parent(s)   Methods Explanation;Demonstration;Verbal cues   Comprehension Verbalized understanding;Need further instruction          SLP Short Term Goals - 10/05/16 0847      SLP SHORT TERM GOAL #1   Title pt will complete HEP with  occasional min A   Time 1   Period --  visit   Status Not Met  and onging     SLP SHORT TERM GOAL #2   Title pt will tell SLP why he is completing HEP with min A   Time 1   Period --  visit   Status Not Met  and ongoing     SLP SHORT TERM GOAL #3   Title pt will tell SLP 3 overt s/s aspiration PNA with modified independence   Time 1   Period --  visit   Status Deferred  and ongoing          SLP Long Term Goals - 10/05/16 0848      SLP LONG TERM GOAL #1   Title pt will tell SLP why he is completing HEP with min A over two sessions   Time 1   Period --  visits   Status On-going     SLP LONG TERM GOAL #2   Title pt will complete HEP with min A over two sessions   Time 1   Period --  visits   Status On-going     SLP LONG TERM GOAL #3   Title pt will tell SLP why a food journal is helpful in returning to most liberal diet   Time 1   Period --  visits   Status On-going          Plan - 10/05/16 0846    Clinical Impression Statement Pt with oropharyngeal swallowing essentially WNL, however the probability of swallowing difficulty remains with radiation therapy. Pt has been noncompliant with HEP, and SLP reviewed possible ramifications of not completing HEP. Pt will need to be followed by SLP for regular assessment of accurate HEP completion as well as for safety with POs both during and following treatment/s.   Speech Therapy Frequency --  approx once every four weeks   Duration --  3 visits   Treatment/Interventions Aspiration precaution training;Pharyngeal strengthening exercises;Diet toleration management by SLP;Compensatory techniques;SLP instruction and feedback;Trials of upgraded texture/liquids;Patient/family education;Cueing hierarchy;Oral motor exercises;Internal/external aids   Potential to Achieve Goals Fair   Potential Considerations Co-morbidities;Cooperation/participation level   Consulted and Agree with Plan of Care Patient      Patient will  benefit from skilled therapeutic intervention in order to improve the following deficits and impairments:   Dysphagia, unspecified type    Problem List Patient Active Problem List   Diagnosis Date Noted  . 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 Schizoaffective disorder, bipolar type ([place]) 02/24/2016  . End stage renal disease ([place]) 07/07/2013  . Chronic kidney disease, stage IV (severe) ([place]) 05/05/2013  . Chronic kidney disease (CKD), stage IV (severe) ([place]) 10/11/2012  . Obesity, unspecified 08/09/2012  . Diabetes mellitus ([place]) 08/09/2012  .  HTN (hypertension) 08/09/2012  . Hyperlipidemia 08/09/2012    [facility] ,[place], [place]  10/05/2016, 8:49 AM  [facility] [address] [place], [place], [address] Phone: [phone]   Fax:  [phone]   Name: Kevin Deleon MRN: [id number] Date of Birth: [birth year]

## 2016-10-06 ENCOUNTER — Ambulatory Visit: Payer: Medicare Other

## 2016-10-06 ENCOUNTER — Ambulatory Visit
Admission: RE | Admit: 2016-10-06 | Discharge: 2016-10-06 | Disposition: A | Discharge status: Home / Self Care | Payer: Medicare Other | Source: Ambulatory Visit | Attending: Radiation Oncology | Admitting: Radiation Oncology

## 2016-10-06 DIAGNOSIS — Z510 Encounter for antineoplastic radiation therapy: Secondary | ICD-10-CM | POA: Diagnosis not present

## 2016-10-06 NOTE — Progress Notes (Addendum)
Nutrition Follow-up:  Nutrition follow-up completed following radiation this am. Mother and step-father with patient during visit.    Mother reports patient is starting to experience some taste changes.  Step-father reports patient is primarily drinking supplements for nutrition at this time.  Eating small amounts of soups and other soft foods.     Medications: magic mouthwash with lidocaine, rena-vit, lantus, glipizide  Labs: no new labs  Anthropometrics:   Noted weight decreased at 228 lb 9.6 oz on 5/21 in radiation 5/15 reported weight 228 lb 5/8 231 lb   Estimated Energy Needs  Kcals: 2200-2500 calories/d (22-25 kcals/kg) Protein: 110-125 g/d Fluid: 1568ml + UOP  NUTRITION DIAGNOSIS: Inadequate oral intake continues   INTERVENTION:   Discussed strategies to help manage taste changes. Fact sheet given. Discussed oral nutrition supplement options with patient and family.  Nepro would be better option with dialysis and DM.  Patient will need 5-6 per day to better meet nutritional needs.  Discussed boost plus/ensure plus pros and cons regarding elevated K, Phosphorus and carbohydrate content compared to nepro.  Will need 6-7 per day of these supplements to better meet nutrition needs.  Also discussed option of combination of both nepro and boost plus/ensure plus.  Mother reports nepro is so expensive.  Family interested in ensure assistance program and supplied 1st complimentary case of ensure plus today at visit.  Also gave patient samples of boost plus to try.      MONITORING, EVALUATION, GOAL: Patient will consume adequate calories and protein to minimize weight loss   NEXT VISIT: May 29 after radiation  Kevin Deleon B. Zenia Resides, [place], [place] Registered Dietitian [phone] (pager)

## 2016-10-07 ENCOUNTER — Ambulatory Visit
Admission: RE | Admit: 2016-10-07 | Discharge: 2016-10-07 | Disposition: A | Discharge status: Home / Self Care | Payer: Medicare Other | Source: Ambulatory Visit | Attending: Radiation Oncology | Admitting: Radiation Oncology

## 2016-10-07 DIAGNOSIS — Z510 Encounter for antineoplastic radiation therapy: Secondary | ICD-10-CM | POA: Diagnosis not present

## 2016-10-08 ENCOUNTER — Ambulatory Visit
Admission: RE | Admit: 2016-10-08 | Discharge: 2016-10-08 | Disposition: A | Discharge status: Home / Self Care | Payer: Medicare Other | Source: Ambulatory Visit | Attending: Radiation Oncology | Admitting: Radiation Oncology

## 2016-10-08 DIAGNOSIS — Z510 Encounter for antineoplastic radiation therapy: Secondary | ICD-10-CM | POA: Diagnosis not present

## 2016-10-09 ENCOUNTER — Ambulatory Visit
Admission: RE | Admit: 2016-10-09 | Discharge: 2016-10-09 | Disposition: A | Discharge status: Home / Self Care | Payer: Medicare Other | Source: Ambulatory Visit | Attending: Radiation Oncology | Admitting: Radiation Oncology

## 2016-10-09 DIAGNOSIS — Z510 Encounter for antineoplastic radiation therapy: Secondary | ICD-10-CM | POA: Diagnosis not present

## 2016-10-11 ENCOUNTER — Encounter (HOSPITAL_COMMUNITY): Payer: Self-pay | Admitting: Dentistry

## 2016-10-13 ENCOUNTER — Ambulatory Visit: Admission: RE | Admit: 2016-10-13 | Payer: Medicare Other | Source: Ambulatory Visit

## 2016-10-13 ENCOUNTER — Telehealth (HOSPITAL_COMMUNITY): Payer: Self-pay

## 2016-10-13 ENCOUNTER — Ambulatory Visit
Admission: RE | Admit: 2016-10-13 | Discharge: 2016-10-13 | Disposition: A | Discharge status: Home / Self Care | Payer: Medicare Other | Source: Ambulatory Visit | Attending: Radiation Oncology | Admitting: Radiation Oncology

## 2016-10-13 ENCOUNTER — Ambulatory Visit: Payer: Medicare Other

## 2016-10-13 VITALS — BP 121/84 | HR 91 | Temp 98.0°F | Resp 20 | Wt 228.8 lb

## 2016-10-13 DIAGNOSIS — C321 Malignant neoplasm of supraglottis: Secondary | ICD-10-CM

## 2016-10-13 MED ORDER — FLUCONAZOLE 100 MG PO TABS: ORAL_TABLET | ORAL | 0 refills | Status: DC

## 2016-10-13 MED FILL — FLUCONAZOLE 100 MG TABLET: 100 | 14 days supply | Qty: 15 | Fill #0

## 2016-10-13 NOTE — Telephone Encounter (Signed)
We need to try oral Abilify first before he can take injection.  If patient agreed that we can try Abilify 10 mg daily and if he remains stable and no other side effects than we will consider Abilify injection every 4 weeks.

## 2016-10-13 NOTE — Progress Notes (Signed)
Nutrition Follow-up:  Nutrition follow-up completed this am.  Patient was unable to complete radiation today as machine was down per mother and step-father.     Mother reports biggest compliant is pain on external skin of neck from radiation.  Noted whitish tongue and mother reports MD Isidore Moos prescribed medication today to help with that.  Noted patient with thrush and diflucan prescribed.  Patient shook head no when asked if any changes from last week (ie thick salivia, dry mouth, etc).   Patient reports that he is drinking ensure plus and nepro.  Reports drinking 2-3 per day.  Patient is also eating soft foods (oatmeal, baked potato, chili at nearby restaurant).     Medications: diflucan  Labs: no recent  Anthropometrics:   Mother reports stable weight of 228 lb 8 oz today,  228 lb 9.6 oz on 5/21 in radiation 5/15 reported weight 228 lb 5/8 231 lb   NUTRITION DIAGNOSIS: Inadequate oral intake stable    INTERVENTION:   Family interested in another case of ensure plus.  2nd complimentary case of ensure plus given to patient today in clinic. If weight decreases will need to increase nepro to 5-6 per day.  If using ensure plus only will need 6-7 per day. Can you combination of both nepro (2) and ensure plus (4).  Family aware of increase in K, Phosphorus and glucose of ensure plus.      MONITORING, EVALUATION, GOAL: Patient will consume adequate calories and protein to minimize weight loss   NEXT VISIT: June 5th  Josha Weekley B. Zenia Resides, [place], [place] Registered Dietitian [phone] (pager)

## 2016-10-13 NOTE — Progress Notes (Signed)
   Weekly Management Note:  Outpatient    ICD-9-CM ICD-10-CM   1. Cancer of supraglottis (HCC) 161.1 C32.1 fluconazole (DIFLUCAN) 100 MG tablet    Current Dose:  40 Gy  Projected Dose: 70 Gy   Narrative:  The patient presents for routine under treatment assessment accompanied by his parents.  CBCT/MVCT images/Port film x-rays were reviewed.  The chart was checked.   Linac 1 is down today, and the patient has not been treated yet. The patient must go to dialysis and will be unable to return when the machine is back up. The patient reports pain 4/10 in severity to the extrenal skin of the neck; he is using Biafine to this area. The patient denies difficulty swallowing. He reports no taste buds, as well as needing to change the consistency of food he eats to soften it. He denies thick sputum. No issues swallowing pills. Sore throat is improved from pre-treatment baseline.  Physical Findings:  weight is 228 lb 12.8 oz (103.8 kg). His oral temperature is 98 F (36.7 C). His blood pressure is 121/84 and his pulse is 91. His respiration is 20 and oxygen saturation is 99%.   Wt Readings from Last 3 Encounters:  10/13/16 228 lb 12.8 oz (103.8 kg)  10/05/16 228 lb 9.6 oz (103.7 kg)  09/08/16 231 lb (104.8 kg)   White coating on tongue, possible for thrush.   Impression:  The patient is tolerating radiotherapy.  Plan:  Continue radiotherapy as planned. The patient will begin to use Neosporin to the skin of the neck. He will return for regular treatment tomorrow, and today's missed treatment will be added on to the end of his treatment schedule. I will prescribe fluconazole for possible thrush. Patient will hold simvastatin for two weeks while taking fluconazole; family verbalizes understanding. Patient and family prefer the [facility] outpatient pharmacy.  ________________________________   Eppie Gibson, M.D.  This document serves as a record of services personally performed by Eppie Gibson,  MD. It was created on her behalf by Maryla Morrow, a trained medical scribe. The creation of this record is based on the scribe's personal observations and the provider's statements to them. This document has been checked and approved by the attending provider.

## 2016-10-13 NOTE — Telephone Encounter (Signed)
Patients mother said that the Risperidone liquid is not going to work, patient does not like to taste it. Patients mother said she would like to have him on a monthly injection so that he will be compliant. She said that she would really like to try Abilify Maintena. Please review and advise, thank you

## 2016-10-13 NOTE — Progress Notes (Signed)
Kevin Deleon is here today.  Linac Machine 1 is down.  Patient hasn't been treated.  Patient has to go to dialysis and will be unable to return when the machine is back up.  Patient reports pain 4 out of 10 to external skin to neck.  Patient reports using Biafine.  Patient denies having any difficulty swallowing.  Does report having to change consistency of food (no teeth present), patient reports no taste buds.  Denies having any thickness of sputum.  No issues swallowing pills.  Skin to neck is red and peeling in areas.    Vitals:   10/13/16 0843  BP: 121/84  Pulse: 91  Resp: 20  Temp: 98 F (36.7 C)  TempSrc: Oral  SpO2: 99%  Weight: 228 lb 12.8 oz (103.8 kg)    Wt Readings from Last 3 Encounters:  10/13/16 228 lb 12.8 oz (103.8 kg)  10/05/16 228 lb 9.6 oz (103.7 kg)  09/08/16 231 lb (104.8 kg)

## 2016-10-14 ENCOUNTER — Other Ambulatory Visit (HOSPITAL_COMMUNITY): Payer: Self-pay

## 2016-10-14 ENCOUNTER — Ambulatory Visit
Admission: RE | Admit: 2016-10-14 | Discharge: 2016-10-14 | Disposition: A | Discharge status: Home / Self Care | Payer: Medicare Other | Source: Ambulatory Visit | Attending: Radiation Oncology | Admitting: Radiation Oncology

## 2016-10-14 ENCOUNTER — Other Ambulatory Visit (HOSPITAL_COMMUNITY): Payer: Self-pay | Admitting: Psychiatry

## 2016-10-14 DIAGNOSIS — Z510 Encounter for antineoplastic radiation therapy: Secondary | ICD-10-CM | POA: Diagnosis not present

## 2016-10-14 MED ORDER — ARIPIPRAZOLE 10 MG PO TABS: 10.0000 mg | ORAL_TABLET | Freq: Every day | ORAL | 0 refills | Status: DC

## 2016-10-14 NOTE — Telephone Encounter (Signed)
I called patients mother and she is agreeable to this plan. I sent Abilify 10 mg to the pharmacy and will enter patient into iassist for the Abilify injection.

## 2016-10-15 ENCOUNTER — Encounter: Payer: Self-pay | Admitting: *Deleted

## 2016-10-15 ENCOUNTER — Ambulatory Visit
Admission: RE | Admit: 2016-10-15 | Discharge: 2016-10-15 | Disposition: A | Discharge status: Home / Self Care | Payer: Medicare Other | Source: Ambulatory Visit | Attending: Radiation Oncology | Admitting: Radiation Oncology

## 2016-10-15 DIAGNOSIS — Z510 Encounter for antineoplastic radiation therapy: Secondary | ICD-10-CM | POA: Diagnosis not present

## 2016-10-15 NOTE — Progress Notes (Signed)
Oncology Nurse Navigator Documentation  Flowsheet update.  Rick Kiera Hussey, RN, BSN, CHPN Head & Neck Oncology Nurse Navigator [facility] [place] [phone]  

## 2016-10-16 ENCOUNTER — Ambulatory Visit
Admission: RE | Admit: 2016-10-16 | Discharge: 2016-10-16 | Disposition: A | Discharge status: Home / Self Care | Payer: Medicare Other | Source: Ambulatory Visit | Attending: Radiation Oncology | Admitting: Radiation Oncology

## 2016-10-16 DIAGNOSIS — Z510 Encounter for antineoplastic radiation therapy: Secondary | ICD-10-CM | POA: Diagnosis not present

## 2016-10-19 ENCOUNTER — Encounter: Payer: Self-pay | Admitting: *Deleted

## 2016-10-19 ENCOUNTER — Ambulatory Visit
Admission: RE | Admit: 2016-10-19 | Discharge: 2016-10-19 | Disposition: A | Discharge status: Home / Self Care | Payer: Medicare Other | Source: Ambulatory Visit | Attending: Radiation Oncology | Admitting: Radiation Oncology

## 2016-10-19 ENCOUNTER — Telehealth: Payer: Self-pay | Admitting: *Deleted

## 2016-10-19 DIAGNOSIS — Z510 Encounter for antineoplastic radiation therapy: Secondary | ICD-10-CM | POA: Diagnosis not present

## 2016-10-19 NOTE — Progress Notes (Signed)
Oncology Nurse Navigator Documentation  Met with Mr. Froelich prior to RT.  He was accompanied by his mother. He reported applying Biafine BID.  I encouraged TID if able. He denied throat pain, other expected SEs. I encouraged them to call me with needs/concerns.  Rick , RN, BSN, CHPN Head & Neck Oncology Nurse Navigator [facility] at [place] [phone]  

## 2016-10-19 NOTE — Telephone Encounter (Signed)
Oncology Nurse Navigator Documentation  Per Dr. Pearlie Oyster request, called [facility].  Spoke with RN Elberta Fortis, requested Dr. Jimmy Footman be informed of Dr. Pearlie Oyster concern re patient's 5 lb weight loss since last week, dehydration, her recommendation for fluids in context of fluid restrictions d/t thrice weekly dialysis.    Elberta Fortis indicated he would inform Charge RN Renee as well as Dr. Jimmy Footman noting Kevin Deleon is scheduled for dialysis tomorrow.    I provided Dr. Pearlie Oyster and my phone numbers for follow-up.  Gayleen Orem, RN, BSN, [place] Neck Oncology Nurse [place] at [place] [phone]

## 2016-10-20 ENCOUNTER — Ambulatory Visit (HOSPITAL_COMMUNITY): Payer: Self-pay | Admitting: Psychiatry

## 2016-10-20 ENCOUNTER — Ambulatory Visit
Admission: RE | Admit: 2016-10-20 | Discharge: 2016-10-20 | Disposition: A | Discharge status: Home / Self Care | Payer: Medicare Other | Source: Ambulatory Visit | Attending: Radiation Oncology | Admitting: Radiation Oncology

## 2016-10-20 ENCOUNTER — Ambulatory Visit: Payer: Medicare Other

## 2016-10-20 DIAGNOSIS — Z510 Encounter for antineoplastic radiation therapy: Secondary | ICD-10-CM | POA: Diagnosis not present

## 2016-10-20 NOTE — Progress Notes (Signed)
Nutrition Follow-up:  Nutrition follow-up complete following radiation treatment this am.  Mother and Step-father are with patient today.    Patient reports no change since last week.  Mother spoke up and said he is not really eating.  Patient reports yesterday he drank 3-4 supplements.  Mother reports tongue still has white coating but not sure if that is because patient drinks a shake before coming to treatment.  Mother reports she has bought patient a brush to brush teeth and tongue.  Also has rinse to use to keep mouth clean as well.   Patient offers no other issues.    Medications: reviewed  Labs: none.  Mother reports has not gotten call from dialysis saying labs are abnormal  Anthropometrics:   Mother reports weight yesterday was 224 lb 8 oz. Last recorded weight was 228 lb  12.8 oz on 5/29 5/21 228 lb 9.6 oz 5/15 228 lb 5/8 231 lb   NUTRITION DIAGNOSIS: Inadequate oral intake continues   INTERVENTION:   Family interested in final case of ensure plus.  3rd (final case) of ensure plus given today in clinic.  Recommended patient drink at least 6 cans of oral nutrition supplements per day (ie 2 nepro and 4 ensure plus or 6 ensure plus). Of if drinking nepro alone can drink 5 cans per day.  Nepro ideally better for patient as lower in K and phosphorus and have discussed this with family in past, although expensive.      MONITORING, EVALUATION, GOAL: Patient will consume adequate calories and protein to minimize weight loss.     NEXT VISIT: June 12  Billie Intriago B. Zenia Resides, [place], [place] Registered Dietitian [phone] (pager)

## 2016-10-21 ENCOUNTER — Ambulatory Visit
Admission: RE | Admit: 2016-10-21 | Discharge: 2016-10-21 | Disposition: A | Discharge status: Home / Self Care | Payer: Medicare Other | Source: Ambulatory Visit | Attending: Radiation Oncology | Admitting: Radiation Oncology

## 2016-10-21 DIAGNOSIS — Z510 Encounter for antineoplastic radiation therapy: Secondary | ICD-10-CM | POA: Diagnosis not present

## 2016-10-22 ENCOUNTER — Ambulatory Visit
Admission: RE | Admit: 2016-10-22 | Discharge: 2016-10-22 | Disposition: A | Discharge status: Home / Self Care | Payer: Medicare Other | Source: Ambulatory Visit | Attending: Radiation Oncology | Admitting: Radiation Oncology

## 2016-10-22 ENCOUNTER — Other Ambulatory Visit (HOSPITAL_COMMUNITY): Payer: Self-pay | Admitting: Psychiatry

## 2016-10-22 DIAGNOSIS — Z510 Encounter for antineoplastic radiation therapy: Secondary | ICD-10-CM | POA: Diagnosis not present

## 2016-10-23 ENCOUNTER — Ambulatory Visit
Admission: RE | Admit: 2016-10-23 | Discharge: 2016-10-23 | Disposition: A | Discharge status: Home / Self Care | Payer: Medicare Other | Source: Ambulatory Visit | Attending: Radiation Oncology | Admitting: Radiation Oncology

## 2016-10-23 DIAGNOSIS — Z510 Encounter for antineoplastic radiation therapy: Secondary | ICD-10-CM | POA: Diagnosis not present

## 2016-10-26 ENCOUNTER — Ambulatory Visit
Admission: RE | Admit: 2016-10-26 | Discharge: 2016-10-26 | Disposition: A | Discharge status: Home / Self Care | Payer: Medicare Other | Source: Ambulatory Visit | Attending: Radiation Oncology | Admitting: Radiation Oncology

## 2016-10-26 ENCOUNTER — Encounter: Payer: Self-pay | Admitting: Radiation Oncology

## 2016-10-26 ENCOUNTER — Inpatient Hospital Stay: Admission: RE | Admit: 2016-10-26 | Payer: Self-pay | Source: Ambulatory Visit | Admitting: Radiation Oncology

## 2016-10-26 VITALS — BP 126/70 | HR 93 | Temp 97.7°F | Wt 224.8 lb

## 2016-10-26 DIAGNOSIS — Z510 Encounter for antineoplastic radiation therapy: Secondary | ICD-10-CM | POA: Diagnosis not present

## 2016-10-26 DIAGNOSIS — C321 Malignant neoplasm of supraglottis: Secondary | ICD-10-CM

## 2016-10-26 MED ORDER — BIAFINE EX EMUL
CUTANEOUS | Status: DC | PRN
  Administered 2016-10-26: 09:00:00 via TOPICAL

## 2016-10-26 NOTE — Progress Notes (Signed)
Mr. Matera presents for his 29th fraction of radiation to his larynx. He denies pain. He admits to fatigue. He is not eating much food by mouth. He is drinking about 6 ensures daily. He is drinking water as needed. He reports taste changes. He continues to attend dialysis 3 days a week. His neck is slightly red, and he is using biafine twice daily. He has finished yeast treatment for his tongue, but there is a coating still visible.   BP 126/70   Pulse 93   Temp 97.7 F (36.5 C)   Wt 224 lb 12.8 oz (102 kg)   SpO2 98% Comment: room air  BMI 31.35 kg/m    Wt Readings from Last 3 Encounters:  10/26/16 224 lb 12.8 oz (102 kg)  10/13/16 228 lb 12.8 oz (103.8 kg)  10/05/16 228 lb 9.6 oz (103.7 kg)

## 2016-10-27 ENCOUNTER — Ambulatory Visit
Admission: RE | Admit: 2016-10-27 | Discharge: 2016-10-27 | Disposition: A | Discharge status: Home / Self Care | Payer: Medicare Other | Source: Ambulatory Visit | Attending: Radiation Oncology | Admitting: Radiation Oncology

## 2016-10-27 ENCOUNTER — Ambulatory Visit: Payer: Medicare Other

## 2016-10-27 DIAGNOSIS — Z510 Encounter for antineoplastic radiation therapy: Secondary | ICD-10-CM | POA: Diagnosis not present

## 2016-10-27 NOTE — Progress Notes (Signed)
Nutrition Follow-up:  Met with patient and mother following radiation therapy this am.  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    Patient reports that he is drinking 6 cans of oral nutrition supplements (combination of nepro and ensure plus).  Mother reports he is eating a little bit more orally this week as compared to last week.  Has oatmeal this am for breakfast.  Although most of nutrition is coming from oral nutrition supplements.  Mother reports this week patient does not seemed to be as fatigued.  Patient has completed treatment for thrush.    Medications: reviewed  Labs: no new labs  Anthropometrics:   Weight stable at 224 lb 12.8 oz on 6/11, same as reported weight from mother on 6/5 of 224 lb 8 oz.   Noted last recorded wt of 228 lb 12.8 oz on 5/29 5/21 228 lb 9.6 oz 5/15 228 lb 5/8 231 lb   NUTRITION DIAGNOSIS: Inadequate oral intake continues    INTERVENTION:   Encouraged patient to continue to drink at least 6 cans of oral nutrition supplement daily (ie 2 nepro 6 ensure plus).  If weight decreases would add additional can. Patient has received all 3 complimentary cases of ensure plus at this time.      MONITORING, EVALUATION, GOAL: Patient will consume adequate calories and protein to minimize weight loss.     NEXT VISIT: Monday,  June 18th.  Patient and mother aware of appointment  Chisum Habenicht B. Zenia Resides, [place], [place] Registered Dietitian [phone] (pager)

## 2016-10-27 NOTE — Progress Notes (Signed)
   Weekly Management Note:  Outpatient    ICD-10-CM   1. Cancer of supraglottis (HCC) C32.1 DISCONTINUED: topical emolient (BIAFINE) emulsion    Current Dose:  58 Gy  Projected Dose: 70 Gy   Narrative:  The patient presents for routine under treatment assessment accompanied by his parents.  CBCT/MVCT images/Port film x-rays were reviewed.  The chart was checked.  Fatigued, taking 6 ensures daily. Drinks when thirsty.  No significant pain.  Physical Findings:  weight is 224 lb 12.8 oz (102 kg). His temperature is 97.7 F (36.5 C). His blood pressure is 126/70 and his pulse is 93. His oxygen saturation is 98%.   Wt Readings from Last 3 Encounters:  10/26/16 224 lb 12.8 oz (102 kg)  10/13/16 228 lb 12.8 oz (103.8 kg)  10/05/16 228 lb 9.6 oz (103.7 kg)   White coating on tongue. Erythema over neck. Skin intact.  Impression:  The patient is tolerating radiotherapy.  Plan:  Continue radiotherapy as planned. The patient will  use Neosporin to the skin of the neck for any peeling. He has taken fluconazole - I believe white coating on tongue may be mucositis/silt from nutritional supplements rather than thrush.  Push nutrition as tolerated. ________________________________   Eppie Gibson, M.D.  This document serves as a record of services personally performed by Eppie Gibson, MD. It was created on her behalf by Maryla Morrow, a trained medical scribe. The creation of this record is based on the scribe's personal observations and the provider's statements to them. This document has been checked and approved by the attending provider.

## 2016-10-28 ENCOUNTER — Ambulatory Visit
Admission: RE | Admit: 2016-10-28 | Discharge: 2016-10-28 | Disposition: A | Discharge status: Home / Self Care | Payer: Medicare Other | Source: Ambulatory Visit | Attending: Radiation Oncology | Admitting: Radiation Oncology

## 2016-10-28 DIAGNOSIS — Z510 Encounter for antineoplastic radiation therapy: Secondary | ICD-10-CM | POA: Diagnosis not present

## 2016-10-29 ENCOUNTER — Ambulatory Visit
Admission: RE | Admit: 2016-10-29 | Discharge: 2016-10-29 | Disposition: A | Discharge status: Home / Self Care | Payer: Medicare Other | Source: Ambulatory Visit | Attending: Radiation Oncology | Admitting: Radiation Oncology

## 2016-10-29 DIAGNOSIS — Z510 Encounter for antineoplastic radiation therapy: Secondary | ICD-10-CM | POA: Diagnosis not present

## 2016-10-30 ENCOUNTER — Ambulatory Visit
Admission: RE | Admit: 2016-10-30 | Discharge: 2016-10-30 | Disposition: A | Discharge status: Home / Self Care | Payer: Medicare Other | Source: Ambulatory Visit | Attending: Radiation Oncology | Admitting: Radiation Oncology

## 2016-10-30 DIAGNOSIS — Z510 Encounter for antineoplastic radiation therapy: Secondary | ICD-10-CM | POA: Diagnosis not present

## 2016-11-02 ENCOUNTER — Other Ambulatory Visit (HOSPITAL_COMMUNITY): Payer: Self-pay | Admitting: Psychiatry

## 2016-11-02 ENCOUNTER — Ambulatory Visit
Admission: RE | Admit: 2016-11-02 | Discharge: 2016-11-02 | Disposition: A | Discharge status: Home / Self Care | Payer: Medicare Other | Source: Ambulatory Visit | Attending: Radiation Oncology | Admitting: Radiation Oncology

## 2016-11-02 ENCOUNTER — Ambulatory Visit: Payer: Medicare Other | Admitting: Nutrition

## 2016-11-02 ENCOUNTER — Ambulatory Visit: Payer: Medicare Other

## 2016-11-02 ENCOUNTER — Encounter: Payer: Self-pay | Admitting: Radiation Oncology

## 2016-11-02 ENCOUNTER — Encounter: Payer: Self-pay | Admitting: Nutrition

## 2016-11-02 VITALS — BP 117/80 | HR 92 | Temp 97.6°F | Resp 18 | Wt 225.6 lb

## 2016-11-02 DIAGNOSIS — Z510 Encounter for antineoplastic radiation therapy: Secondary | ICD-10-CM | POA: Diagnosis not present

## 2016-11-02 DIAGNOSIS — C321 Malignant neoplasm of supraglottis: Secondary | ICD-10-CM

## 2016-11-02 NOTE — Progress Notes (Signed)
   Weekly Management Note:  Outpatient    ICD-10-CM   1. Cancer of supraglottis (HCC) C32.1     Current Dose:  68 Gy  Projected Dose: 70 Gy   Narrative:  The patient presents for routine under treatment assessment accompanied by his parents.  CBCT/MVCT images/Port film x-rays were reviewed.  The chart was checked. No pain. Doing well. Did regurgitate oatmeal this weekend.  Weight stable. No nausea  Physical Findings:  weight is 225 lb 9.6 oz (102.3 kg). His oral temperature is 97.6 F (36.4 C). His blood pressure is 117/80 and his pulse is 92. His respiration is 18 and oxygen saturation is 100%.   Wt Readings from Last 3 Encounters:  11/02/16 225 lb 9.6 oz (102.3 kg)  10/26/16 224 lb 12.8 oz (102 kg)  10/13/16 228 lb 12.8 oz (103.8 kg)   White coating on tongue is not worse - no thrush on buccal mucosa or floor of mouth. Erythema over neck. Skin intact.  Impression:  The patient is tolerating radiotherapy.  Plan:  Continue radiotherapy as planned. The patient will  use Neosporin to the skin of the neck for any peeling. He has taken fluconazole - I believe white coating on tongue may be mucositis/silt from nutritional supplements rather than thrush.  Push nutrition as tolerated. F/u July 3rd  with me and f/u with SLP as scheduled. ________________________________   Eppie Gibson, M.D.

## 2016-11-02 NOTE — Progress Notes (Signed)
Nutrition follow-up completed with patient and parents status post radiation therapy for epiglottic cancer. Weight was documented as 225 pounds today which is stable overall. Patient is drinking 4 bottles of Ensure Plus +2 bottles of Nepro daily to meet majority of estimated needs. He does try to eat small amounts of soft foods throughout the day. Patient has occasional nausea and vomiting.  Nutrition diagnosis: Inadequate oral intake has improved.  Intervention: Patient educated to continue strategies for adequate oral intake. Encouraged him to slowly progress to solid foods as tolerated. Educated patient on strategies for reducing thick mucus to include increased hydration. Teach back method used.  Monitoring, evaluation, goals: Patient will continue adequate calories and protein for weight maintenance.  No follow-up has been scheduled.  Patient will complete radiation Tuesday, January 19.  **Disclaimer: This note was dictated with voice recognition software. Similar sounding words can inadvertently be transcribed and this note may contain transcription errors which may not have been corrected upon publication of note.**

## 2016-11-02 NOTE — Progress Notes (Addendum)
weekly rad txs larynx,  34/35  Completed, patient denies pain, eating softer foods,oatmeal, patient drinking ensure plus supplements, looks like thrush still on tongue, Mother states"He has finished his diflucan,  taste  Not good stated patient,  D/c risperidone, now on abilify 10mg  daily,  No c.o nausea, or fatigue folow up July 3,2018 8:51 AM BP 117/80   Pulse 92   Temp 97.6 F (36.4 C) (Oral)   Resp 18   Wt 225 lb 9.6 oz (102.3 kg)   SpO2 100%   BMI 31.46 kg/m   Wt Readings from Last 3 Encounters:  11/02/16 225 lb 9.6 oz (102.3 kg)  10/26/16 224 lb 12.8 oz (102 kg)  10/13/16 228 lb 12.8 oz (103.8 kg)

## 2016-11-03 ENCOUNTER — Ambulatory Visit: Payer: Medicare Other

## 2016-11-03 ENCOUNTER — Ambulatory Visit
Admission: RE | Admit: 2016-11-03 | Discharge: 2016-11-03 | Disposition: A | Discharge status: Home / Self Care | Payer: Medicare Other | Source: Ambulatory Visit | Attending: Radiation Oncology | Admitting: Radiation Oncology

## 2016-11-03 ENCOUNTER — Encounter: Payer: Self-pay | Admitting: Radiation Oncology

## 2016-11-03 DIAGNOSIS — Z510 Encounter for antineoplastic radiation therapy: Secondary | ICD-10-CM | POA: Diagnosis not present

## 2016-11-04 ENCOUNTER — Ambulatory Visit: Payer: Medicare Other

## 2016-11-06 ENCOUNTER — Other Ambulatory Visit (HOSPITAL_COMMUNITY): Payer: Self-pay

## 2016-11-06 ENCOUNTER — Ambulatory Visit (INDEPENDENT_AMBULATORY_CARE_PROVIDER_SITE_OTHER): Payer: 59 | Admitting: Psychiatry

## 2016-11-06 ENCOUNTER — Encounter (HOSPITAL_COMMUNITY): Payer: Self-pay | Admitting: Psychiatry

## 2016-11-06 VITALS — BP 126/78 | HR 92 | Ht 70.0 in | Wt 225.6 lb

## 2016-11-06 DIAGNOSIS — Z87891 Personal history of nicotine dependence: Secondary | ICD-10-CM

## 2016-11-06 DIAGNOSIS — Z7984 Long term (current) use of oral hypoglycemic drugs: Secondary | ICD-10-CM | POA: Diagnosis not present

## 2016-11-06 DIAGNOSIS — F200 Paranoid schizophrenia: Secondary | ICD-10-CM

## 2016-11-06 DIAGNOSIS — C140 Malignant neoplasm of pharynx, unspecified: Secondary | ICD-10-CM

## 2016-11-06 DIAGNOSIS — Z79899 Other long term (current) drug therapy: Secondary | ICD-10-CM

## 2016-11-06 DIAGNOSIS — F250 Schizoaffective disorder, bipolar type: Secondary | ICD-10-CM

## 2016-11-06 DIAGNOSIS — Z794 Long term (current) use of insulin: Secondary | ICD-10-CM

## 2016-11-06 MED ORDER — ARIPIPRAZOLE 10 MG PO TABS: 10.0000 mg | ORAL_TABLET | Freq: Every day | ORAL | 1 refills | Status: DC

## 2016-11-06 NOTE — Progress Notes (Signed)
[facility] MD/PA/NP OP Progress Note  11/06/2016 11:33 AM Kevin Deleon  MRN:  [id number]  Chief Complaint:  Subjective:  I'm doing okay with Abilify.  I'm sleeping better with trazodone.  HPI: Patient came for his follow-up appointment with his mother.  He is now taking 10 mg daily.  We started him on trazodone and he is sleeping much better with the trazodone.  Recently he finished radiation for his throat cancer.  He is tolerating very well his medication he has no tremors shakes or any EPS.  He denies any irritability, paranoia or any recent rage or any anger issues.  His mother endorsed there are times that he missed medication and she wants him to be on Abilify injection.  Patient has no objection with injection.  His getting dialysis but he is not happy on treatment days because he has to sit there for 4 hours.  He admitted gets very tired and exhausted postdialysis.  Patient denies drinking alcohol or using any illegal substances.  He has no tremors or shakes.  His energy level is fair.  His appetite is okay.  Visit Diagnosis:    ICD-10-CM   1. Schizoaffective disorder, bipolar type ([place]) F25.0 ARIPiprazole (ABILIFY) 10 MG tablet    Past Psychiatric History: Reviewed. Patient has psychiatric illness since age 46. He has paranoia, mania, psychosis and disorganized thinking. He has at least 5 psychiatric hospitalization mostly due to noncompliance with medication. He was seeing [facility] mental health for many years. In the past he had tried Haldol which he actually likes but when the dose was increased to 15 mg he became zombie and stopped. He has taken lithium in the past this was stopped because of renal insufficiency. In the past he had tried amitriptyline given by Dr. Tamala Julian and Risperdal but recently switched to Abilify because of better compliance.    Past Medical History:  Past Medical History:  Diagnosis Date  . Chronic kidney disease    dialysis Tue-Thur-Sat  . Diabetes  mellitus ([place])   . Dialysis patient ([place])    T, TH, SAT at [facility]  . GERD (gastroesophageal reflux disease)   . HTN (hypertension)    off BP meds since dialysis started 46  . Hyperlipidemia   . Liver disease    patient denied 08/26/16  . Mental disorder    Schizo- affective disorder  . Schizophrenia ([place])    schizo-affective  . Smoker    quit one week ago  . Smokers' cough [facility])     Past Surgical History:  Procedure Laterality Date  . AV FISTULA PLACEMENT Left 05/24/2013   Procedure: ARTERIOVENOUS (AV) FISTULA CREATION BRACHIAL-CEPHALIC;  Surgeon: Conrad August, MD;  Location: [place];  Service: Vascular;  Laterality: Left;  . DIRECT LARYNGOSCOPY N/A 08/17/2016   Procedure: DIRECT LARYNGOSCOPY WITH BIOPSY AND FROZEN SECTION;  Surgeon: Rozetta Nunnery, MD;  Location: [place];  Service: ENT;  Laterality: N/A;  . MULTIPLE EXTRACTIONS WITH ALVEOLOPLASTY N/A 08/31/2016   Procedure: Extraction of tooth #'s 2-10, 13-15, and 19-30 with alveoloplasty;  Surgeon: Lenn Cal, DDS;  Location: [place];  Service: Oral Surgery;  Laterality: N/A;    Family Psychiatric History: Reviewed.  Family History:  Family History  Problem Relation Age of Onset  . Cancer - Lung Other   . Cancer Other     Social History:  Social History   Social History  . Marital status: Single    Spouse name: N/A  .  Number of children: N/A  . Years of education: N/A   Social History Main Topics  . Smoking status: Former Smoker    Packs/day: 1.00    Years: 20.00    Types: Cigarettes    Quit date: 08/12/2016  . Smokeless tobacco: Never Used  . Alcohol use No     Comment: occ  . Drug use: No  . Sexual activity: Not Asked   Other Topics Concern  . None   Social History Narrative  . None    Allergies: No Known Allergies  Metabolic Disorder Labs: Lab Results  Component Value Date   HGBA1C 7.1 (H) 08/26/2016   MPG 157 08/26/2016   No results found for:  PROLACTIN No results found for: CHOL, TRIG, HDL, CHOLHDL, VLDL, LDLCALC   Current Medications: Current Outpatient Prescriptions  Medication Sig Dispense Refill  . ARIPiprazole (ABILIFY) 10 MG tablet Take 1 tablet (10 mg total) by mouth daily. 30 tablet 1  . glipiZIDE (GLUCOTROL) 5 MG tablet Take 5 mg by mouth daily at 12 noon.     Marland Kitchen LANTUS SOLOSTAR 100 UNIT/ML injection Inject 28 Units into the skin at bedtime.     . nicotine (NICODERM CQ - DOSED IN MG/24 HOURS) 21 mg/24hr patch Place 21 mg onto the skin daily.    . ONE TOUCH ULTRA TEST test strip 1 each by Other route every evening.     . simvastatin (ZOCOR) 20 MG tablet Take 20 mg by mouth daily at 12 noon.     . traZODone (DESYREL) 50 MG tablet Take 1 - 2 tablets at bedtime as needed for sleep 180 tablet 0   No current facility-administered medications for this visit.     Neurologic: Headache: No Seizure: No Paresthesias: No  Musculoskeletal: Strength & Muscle Tone: within normal limits Gait & Station: normal Patient leans: N/A  Psychiatric Specialty Exam: Review of Systems  Constitutional: Negative.   HENT: Negative.   Genitourinary: Negative.   Musculoskeletal: Negative.   Skin: Negative.     Blood pressure 126/78, pulse 92, height 5\' 10"  (1.778 m), weight 225 lb 9.6 oz (102.3 kg).Body mass index is 32.37 kg/m.  General Appearance: Casual  Eye Contact:  Fair  Speech:  Slow  Volume:  Normal  Mood:  Euthymic  Affect:  Flat  Thought Process:  Goal Directed  Orientation:  Full (Time, Place, and Person)  Thought Content: WDL   Suicidal Thoughts:  No  Homicidal Thoughts:  No  Memory:  Immediate;   Fair Recent;   Fair Remote;   Fair  Judgement:  Good  Insight:  Good  Psychomotor Activity:  Decreased  Concentration:  Concentration: Fair and Attention Span: Fair  Recall:  [employer] of Knowledge: Fair  Language: Fair  Akathisia:  No  Handed:  Right  AIMS (if indicated):  0  Assets:  Desire for  Improvement Housing Social Support  ADL's:  Intact  Cognition: WNL  Sleep:  Good.      Assessment: Schizophrenia chronic paranoid type.  Plan: Patient is taking Abilify by mouth.  However his mother wants to put him on Abilify injection due to increase compliance.  Due to recent throat cancer he has difficulty some time swallowing the tablet.  Patient is getting dialysis Tuesday Thursday and Saturday.  He is not on any transplant list.  He is sleeping better with trazodone.  I will continue trazodone at present dose.  We will start Abilify injection 400 mg intramuscular every 4 weeks.  For  now he will continue Abilify by mouth however we will discontinued on his next appointment.  Recommended to call us back if he has any question, concern or if he feel worsening of the symptom.  Follow-up in 2 months.  Dartanyan Deasis T., MD 11/06/2016, 11:33 AM

## 2016-11-09 ENCOUNTER — Ambulatory Visit (INDEPENDENT_AMBULATORY_CARE_PROVIDER_SITE_OTHER): Payer: 59

## 2016-11-09 VITALS — BP 136/82 | HR 94 | Ht 70.0 in | Wt 225.6 lb

## 2016-11-09 DIAGNOSIS — F250 Schizoaffective disorder, bipolar type: Secondary | ICD-10-CM | POA: Diagnosis not present

## 2016-11-09 MED ORDER — ARIPIPRAZOLE ER 400 MG IM SRER
400.0000 mg | Freq: Once | INTRAMUSCULAR | Status: AC
  Administered 2016-11-09: 400 mg via INTRAMUSCULAR

## 2016-11-09 NOTE — Progress Notes (Signed)
Patient came in with flat affect for his first injection of Abilify Maintena 400 mg injection ordered by Dr. Adele Schilder at his last visit. Patient is waiting on [place] to approve his application for patient assistance and will be given a sample today. Patient is not experiencing SI/HI, and the medication and side affects were discussed with him and his mother who is also present today. The injection of Abilify Maintena was prepared as ordered and given in patients right deltoid - I did explain that we normally give this in the glut, but patient insisted that he preferred his arm. The patient tolerated the procedure well and without complaint. Patient and his mother advised to call this Probation officer with any questions or if experiencing any adverse side affects.

## 2016-11-10 ENCOUNTER — Telehealth: Payer: Self-pay | Admitting: *Deleted

## 2016-11-10 NOTE — Telephone Encounter (Signed)
CALLED PATIENT TO INFORM THAT DR. SQUIRE WOULD BE GLAD TO SEE HIM ON 11-16-16, APPT. IS FOR 11-16-16 @ 9:30 AM WITH DR. Isidore Moos, SPOKE WITH PATIENT'S MOTHER AND SHE IS AWARE OF THIS APPT.

## 2016-11-11 ENCOUNTER — Other Ambulatory Visit (HOSPITAL_COMMUNITY): Payer: Self-pay

## 2016-11-11 ENCOUNTER — Encounter: Payer: Self-pay | Admitting: Radiation Oncology

## 2016-11-11 MED ORDER — TRAZODONE HCL 50 MG PO TABS: ORAL_TABLET | ORAL | 0 refills | Status: DC

## 2016-11-13 ENCOUNTER — Encounter: Payer: Self-pay | Admitting: Radiation Oncology

## 2016-11-13 NOTE — Progress Notes (Signed)
  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 He experienced radiation related skin changes including erythema over the neck. He denied significant pain throughout treatment. He reported one episode of vomiting which subsided without intervention. Overall the patient was without complaint. He avoided a feeding tube with excellent weight maintenance.  Plan: The patient has completed radiation treatment. The patient was instructed to use Neosporin to the skin of the neck prn for peeling. He will continue to push nutrition as tolerated. He will follow up with SLP as scheduled. The patient will return to radiation oncology clinic for routine followup in one half month. I advised the patient to call or return sooner if any questions or concerns arise that are related to recovery or treatment.  -----------------------------------  Eppie Gibson, MD  This document serves as a record of services personally performed by Eppie Gibson, MD. It was created on her behalf by Maryla Morrow, a trained medical scribe. The creation of this record is based on the scribe's personal observations and the provider's statements to them. This document has been checked and approved by the attending provider.

## 2016-11-16 ENCOUNTER — Ambulatory Visit
Admission: RE | Admit: 2016-11-16 | Discharge: 2016-11-16 | Disposition: A | Discharge status: Home / Self Care | Payer: Medicare Other | Source: Ambulatory Visit | Attending: Radiation Oncology | Admitting: Radiation Oncology

## 2016-11-16 ENCOUNTER — Ambulatory Visit: Payer: Medicare Other

## 2016-11-16 ENCOUNTER — Encounter: Payer: Self-pay | Admitting: Radiation Oncology

## 2016-11-16 DIAGNOSIS — Z510 Encounter for antineoplastic radiation therapy: Secondary | ICD-10-CM | POA: Diagnosis not present

## 2016-11-16 DIAGNOSIS — C321 Malignant neoplasm of supraglottis: Secondary | ICD-10-CM

## 2016-11-16 HISTORY — DX: Personal history of irradiation: Z92.3

## 2016-11-16 NOTE — Progress Notes (Signed)
Kevin Deleon presents for follow up of radiation completed 11/03/16 to his Supraglottis.   Pain issues, if any: He denies.  Using a feeding tube?: No Weight changes, if any:  Wt Readings from Last 3 Encounters:  11/16/16 225 lb (102.1 kg)  11/02/16 225 lb 9.6 oz (102.3 kg)  10/26/16 224 lb 12.8 oz (102 kg)   Swallowing issues, if any: He denies difficulty swallowing. He modifies his diet due to no teeth.  Smoking or chewing tobacco? No Using fluoride trays daily? N/A Last ENT visit was on: Not since diagnosis.  Other notable issues, if any:  His appointment with Alba Destine. Was cancelled at the last moment today. They would like to discuss if they should reschedule.   BP 119/61   Pulse (!) 102   Temp 98.1 F (36.7 C)   Ht 5\' 10"  (1.778 m)   Wt 225 lb (102.1 kg)   SpO2 100% Comment: room air  BMI 32.28 kg/m

## 2016-11-16 NOTE — Progress Notes (Signed)
Radiation Oncology         (336) [phone] ________________________________  Name: Kevin Deleon MRN: [id number]  Date: 11/16/2016  DOB: 05-12-1971  Follow-Up Visit Note  CC: Patient, No Pcp Per  Rozetta Nunnery, *  Diagnosis and Prior Radiotherapy:       ICD-10-CM   1. 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  Doing well.  Denies new issues and patient/family feel he is recovering well.                    ALLERGIES:  has No Known Allergies.  Meds: Current Outpatient Prescriptions  Medication Sig Dispense Refill  . ARIPiprazole (ABILIFY) 10 MG tablet Take 1 tablet (10 mg total) by mouth daily. 30 tablet 1  . glipiZIDE (GLUCOTROL) 5 MG tablet Take 5 mg by mouth daily at 12 noon.     Marland Kitchen LANTUS SOLOSTAR 100 UNIT/ML injection Inject 28 Units into the skin at bedtime.     . nicotine (NICODERM CQ - DOSED IN MG/24 HOURS) 21 mg/24hr patch Place 21 mg onto the skin daily.    . ONE TOUCH ULTRA TEST test strip 1 each by Other route every evening.     . simvastatin (ZOCOR) 20 MG tablet Take 20 mg by mouth daily at 12 noon.     . traZODone (DESYREL) 50 MG tablet Take 1 - 2 tablets at bedtime as needed for sleep 180 tablet 0   No current facility-administered medications for this encounter.     Physical Findings: The patient is in no acute distress. Patient is alert and oriented. Wt Readings from Last 3 Encounters:  11/16/16 225 lb (102.1 kg)  11/02/16 225 lb 9.6 oz (102.3 kg)  10/26/16 224 lb 12.8 oz (102 kg)    height is 5\' 10"  (1.778 m) and weight is 225 lb (102.1 kg). His temperature is 98.1 F (36.7 C). His blood pressure is 119/61 and his pulse is 102 (abnormal). His oxygen saturation is 100%. .  General: Alert and oriented, in no acute distress HEENT: Head is normocephalic. Extraocular movements are intact. Oropharynx is notable for  resolving mucositis, improving white surface on tongue Skin: Skin in treatment fields shows satisfactory healing over neck  Lab Findings: Lab Results  Component Value Date   WBC 10.9 (H) 08/26/2016   HGB 13.3 08/31/2016   HCT 39.0 08/31/2016   MCV 97.8 08/26/2016   PLT 244 08/26/2016    No results found for: TSH  Radiographic Findings: No results found.  Impression/Plan:    1) Head and Neck Cancer Status: healing well  2) Nutritional Status: stable  PEG tube: none  3) Risk Factors: The patient has been educated about risk factors including alcohol and tobacco abuse; they understand that avoidance of alcohol and tobacco is important to prevent recurrences as well as other cancers  4) Swallowing: functional  5) Dental: Encouraged to eventually followup with dentistry for dentures, but he knows that mouth may need to heal for months before fitting is appropriate  6) Thyroid function: No results found for: TSH -- check at next visit  7) Other: no other issues; family informed they should reschedule SLP appt to prevent long term dysphagia  8) Follow-up in 2.5-3 months with PET scan for restaging. The patient and family were encouraged to call with any issues or questions before then.  __________________________________   Eppie Gibson, MD

## 2016-11-17 ENCOUNTER — Ambulatory Visit: Payer: Self-pay | Admitting: Radiation Oncology

## 2016-11-18 ENCOUNTER — Other Ambulatory Visit: Payer: Self-pay | Admitting: Radiation Oncology

## 2016-11-18 DIAGNOSIS — Z1329 Encounter for screening for other suspected endocrine disorder: Secondary | ICD-10-CM

## 2016-11-18 DIAGNOSIS — C321 Malignant neoplasm of supraglottis: Secondary | ICD-10-CM

## 2016-11-19 ENCOUNTER — Encounter: Payer: Self-pay | Admitting: *Deleted

## 2016-11-25 ENCOUNTER — Ambulatory Visit: Payer: Self-pay | Admitting: Radiation Oncology

## 2016-12-01 ENCOUNTER — Telehealth (HOSPITAL_COMMUNITY): Payer: Self-pay

## 2016-12-01 NOTE — Telephone Encounter (Signed)
He should continue on Abilify 10 mg for another 2 weeks and then cut down to 5 mg for another 2 weeks.  Usually injection takes 1-2 cycle to be effective.

## 2016-12-01 NOTE — Telephone Encounter (Signed)
Patients mother called, after taking the injection of Abilify, patient continued on the pill for 2 more weeks. After that patient became symptomatic. Patients mother states that he became manic, she started him back on his Abilify to see if that would help. Please review and advise, thank you

## 2016-12-02 NOTE — Telephone Encounter (Signed)
Called patients mother and relayed to her what Dr. Adele Schilder said, she was agreeable to this. She and patient will be here Monday for his injection

## 2016-12-07 ENCOUNTER — Ambulatory Visit (HOSPITAL_COMMUNITY): Payer: Medicaid - Dental | Admitting: Dentistry

## 2016-12-07 ENCOUNTER — Ambulatory Visit (INDEPENDENT_AMBULATORY_CARE_PROVIDER_SITE_OTHER): Payer: 59

## 2016-12-07 ENCOUNTER — Encounter (HOSPITAL_COMMUNITY): Payer: Self-pay | Admitting: Dentistry

## 2016-12-07 VITALS — BP 123/72 | HR 92 | Temp 98.3°F | Wt 225.0 lb

## 2016-12-07 DIAGNOSIS — K0820 Unspecified atrophy of edentulous alveolar ridge: Secondary | ICD-10-CM

## 2016-12-07 DIAGNOSIS — C321 Malignant neoplasm of supraglottis: Secondary | ICD-10-CM

## 2016-12-07 DIAGNOSIS — K117 Disturbances of salivary secretion: Secondary | ICD-10-CM

## 2016-12-07 DIAGNOSIS — K08109 Complete loss of teeth, unspecified cause, unspecified class: Secondary | ICD-10-CM

## 2016-12-07 DIAGNOSIS — F250 Schizoaffective disorder, bipolar type: Secondary | ICD-10-CM | POA: Diagnosis not present

## 2016-12-07 DIAGNOSIS — R1310 Dysphagia, unspecified: Secondary | ICD-10-CM

## 2016-12-07 DIAGNOSIS — R432 Parageusia: Secondary | ICD-10-CM

## 2016-12-07 DIAGNOSIS — Z923 Personal history of irradiation: Secondary | ICD-10-CM

## 2016-12-07 DIAGNOSIS — R682 Dry mouth, unspecified: Secondary | ICD-10-CM

## 2016-12-07 MED ORDER — ARIPIPRAZOLE ER 400 MG IM PRSY
400.0000 mg | PREFILLED_SYRINGE | INTRAMUSCULAR | Status: AC
  Administered 2016-12-07: 400 mg via INTRAMUSCULAR

## 2016-12-07 NOTE — Progress Notes (Signed)
Patient presented today with flat affect and appropriate mood for his injection of Abilify Maintena 400 mg. Patient is still waiting on funding from [place] and was given a sample today. Patient states his arm was a little sore after last injection, but he would still rather get it in the arm. Injection of Abilify Maintena 400 mg was prepared as ordered and given to patient in Left deltoid. Patient tolerated the procedure well and without complaint and will follow up in 30 days.

## 2016-12-07 NOTE — Patient Instructions (Addendum)
RECOMMENDATIONS: 1. Brush tongue twice a day.  2. Use trismus exercises as directed. 3. Use Biotene Rinse rinses s needed. 4. Multiple sips of water as needed. 5. Return to dentist of his choice for fabrication of upper and lower complete dentures. Do not start denture fabrication until 02/03/2017 due to the risk for causing osteoradionecrosis.  Pre-extraction models were given to the mother today to take to Dentist to aid in fabrication of dentures.  Lenn Cal, DDS    RADIATION THERAPY AND DECISIONS REGARDING YOUR TEETH  Xerostomia (dry mouth) Your salivary glands may be in the filed of radiation.  Radiation may include all or part of your saliva glands.  This will cause your saliva to dry up and you will have a dry mouth.  The dry mouth will be for the rest of your life unless your radiation oncologist tells you otherwise.  Your saliva has many functions:  Saliva wets your tongue for speaking.  It coats your teeth and the inside of your mouth for easier movement.  It helps with chewing and swallowing food.  It helps clean away harmful acid and toxic products made by the germs in your mouth, therefore it helps prevent cavities.  It kills some germs in your mouth and helps to prevent gum disease.  It helps to carry flavor to your taste buds.  Once you have lost your saliva you will be at higher risk for tooth decay and gum disease.  What can be done to help improve your mouth when there's not enough saliva:  1.  Your dentist may give a prescription for Salagen.  It will not bring back all of your saliva but may bring back some of it.  Also your saliva may be thick and ropy or white and foamy. It will not feel like it use to feel.  2.  You will need to swish with water every time your mouth feels dry.  YOU CANNOT suck on any cough drops, mints, lemon drops, candy, vitamin C or any other products.  You cannot use anything other than water to make your mouth feel less dry.  If  you want to drink anything else you have to drink it all at once and brush afterwards.  Be sure to discuss the details of your diet habits with your dentist or hygienist.  Radiation caries: This is decay that happens very quickly once your mouth is very dry due to radiation therapy.  Normally cavities take six months to two years to become a problem.  When you have dry mouth cavities may take as little as eight weeks to cause you a problem.  This is why dental check ups every two months are necessary as long as you have a dry mouth. Radiation caries typically, but not always, start at your gum line where it is hard to see the cavity.  It is therefore also hard to fill these cavities adequately.  This high rate of cavities happens because your mouth no longer has saliva and therefore the acid made by the germs starts the decay process.  Whenever you eat anything the germs in your mouth change the food into acid.  The acid then burns a small hole in your tooth.  This small hole is the beginning of a cavity.  If this is not treated then it will grow bigger and become a cavity.  The way to avoid this hole getting bigger is to use fluoride every evening as prescribed by your dentist.  You have to make sure that your teeth are very clean before you use the fluoride.  This fluoride in turn will strengthen your teeth and prepare them for another day of fighting acid.  If you develop radiation caries many times the damage is so large that you will have to have all your teeth removed.  This could be a big problem if some of these teeth are in the field of radiation.  Further details of why this could be a big problem will follow.  (See Osteoradionecrosis).  Loss of taste (dysgeusia) This happens to varying degrees once you've had radiation therapy to your jaw region.  Many times taste is not completely lost but becomes limited.  The loss of taste is mostly due to radiation affecting your taste buds.  However if you  have no saliva in your mouth to carry the flavor to your taste buds it would be difficult for your taste buds to taste anything.  That is why using water or a prescription for Salagen prior to meals and during meals may help with some of the taste.  Keep in mind that taste generally returns very slowly over the course of several months or several years after radiation therapy.  Don't give up hope.  Trismus According to your Radiation Oncologist your TMJ or jaw joints are going to be partially or fully in the field of radiation.  This means that over time the muscles that help you open and close your mouth may get stiff.  This will potentially result in your not being able to open your mouth wide enough or as wide as you can open it now.  Le me give you an example of how slowly this happens and how unaware people are of it.  A gentlemen that had radiation therapy two years ago came back to me complaining that bananas are just too large for him to be able to fit them in between his teeth.  He was not able to open wide enough to bite into a banana.  This happens slowly and over a period of time.  What do we do to try and prevent this?  Your dentist will probably give you a stack of sticks called a trismus exercise device .  This stack will help your remind your muscles and your jaw joint to open up to the same distance every day.  Use these sticks every morning when you wake up according to the instructions given by the dentist.   You must use these sticks for at least one to two years after radiation therapy.  The reason for that is because it happens so slowly and keeps going on for about two years after radiation therapy.  Your hospital dentist will help you monitor your mouth opening and make sure that it's not getting smaller.  Osteoradionecrosis (ORN) This is a condition where your jaw bone after having had radiation therapy becomes very dry.  It has very little blood supply to keep it alive.  If you  develop a cavity that turns into an abscess or an infection then the jaw bone does not have enough blood supply to help fight the infection.  At this point it is very likely that the infection could cause the death of your jaw bone.  When you have dead bone it has to be removed.  Therefore you might end up having to have surgery to remove part of your jaw bone, the part of the jaw bone that has  been affected.   Healing is also a problem if you are to have surgery in the areas where the bone has had radiation therapy.  The same reasons apply.  If you have surgery you need more blood supply which is not available.  When blood supply and oxygen are not available again, there is a chance for the bone to die.  Occasionally ORN happens on its own with no obvious reason.  This is quite rare.  We believe that patients who continue to smoke and/or drink alcohol have a higher chance of having this bone problem.  Therefore once your jaw bone has had radiation therapy if there are any teeth in that area, you should never have them pulled.  You should also never have any surgery on your teeth or gums in that area unless the oral surgeon or Periodontist is aware of your history of radiation. There is some expensive management techniques that might be used to limit your risks.  The risks for ORN either from infection or spontaneous ( or on it's own) are life long.    TRISMUS  Trismus is a condition where the jaw does not allow the mouth to open as wide as it usually does.  This can happen almost suddenly, or in other cases the process is so slow, it is hard to notice it-until it is too far along.  When the jaw joints and/or muscles have been exposed to radiation treatments, the onset of Trismus is very slow.  This is because the muscles are losing their stretching ability over a long period of time, as long as 2 YEARS after the end of radiation.  It is therefore important to exercise these muscles and joints.  TRISMUS  EXERCISES   Stack of tongue depressors measuring the same or a little less than the last documented MIO (Maximum Interincisal Opening).  Secure them with a rubber band on both ends.  Place the stack in the patient's mouth, supporting the other end.  Allow 30 seconds for muscle stretching.  Rest for a few seconds.  Repeat 3-5 times  For all radiation patients, this exercise is recommended in the mornings and evenings unless otherwise instructed.  The exercise should be done for a period of 2 YEARS after the end of radiation.  MIO should be checked routinely on recall dental visits by the general dentist or the hospital dentist.  The patient is advised to report any changes, soreness, or difficulties encountered when doing the exercises.

## 2016-12-07 NOTE — Progress Notes (Signed)
12/07/2016  Patient Name:   Kevin Deleon Date of Birth:   [birth year] Medical Record Number: [id number]  BP 123/72 (BP Location: Right Arm)   Pulse 92   Temp 98.3 F (36.8 C) (Oral)   Wt 225 lb (102.1 kg)   BMI 32.28 kg/m   [name] presents for oral examination after radiation therapy. Patient has completed all radiation treatments from 09/14/16 thru 11/03/16. No chemotherapy.  REVIEW OF CHIEF COMPLAINTS:  DRY MOUTH: Yes-minimal. HARD TO SWALLOW: Yes, at times.  HURT TO SWALLOW: No. TASTE CHANGES: Taste is returning slowly. SORES IN MOUTH: None noted TRISMUS: No problems with trismus symptoms. WEIGHT: 225 pounds.  HOME OH REGIMEN:  BRUSHING: Edentulous. Not brushing his tongue. FLOSSING: Not applicable RINSING: Using Biotene rinses. FLUORIDE: Not applicable TRISMUS EXERCISES:  Maximum interincisal opening: 45 mm   DENTAL EXAM:  Oral Hygiene:(PLAQUE): Plaque noted on tongue. Brushing tongue twice daily was recommended. LOCATION OF MUCOSITIS: None noted DESCRIPTION OF SALIVA: Decreased saliva. Mild xerostomia. ANY EXPOSED BONE: None noted OTHER WATCHED AREAS: Previous extraction sites. DX: Xerostomia, Dysgeusia, Dysphagia and edentulous, and atrophy of alveolar ridges.  RECOMMENDATIONS: 1. Brush tongue twice a day.  2. Use trismus exercises as directed. 3. Use Biotene Rinse rinses s needed. 4. Multiple sips of water as needed. 5. Return to dentist of his choice for fabrication of upper and lower complete dentures. Do not start denture fabrication until 02/03/2017 due to the risk for causing osteoradionecrosis.  Pre-extraction models were given to the mother today to take to the Dentist to aid in fabrication of dentures.  Lenn Cal, DDS

## 2016-12-08 ENCOUNTER — Ambulatory Visit (HOSPITAL_COMMUNITY): Payer: Self-pay

## 2016-12-30 ENCOUNTER — Other Ambulatory Visit (HOSPITAL_COMMUNITY): Payer: Self-pay | Admitting: Psychiatry

## 2017-01-06 ENCOUNTER — Ambulatory Visit (INDEPENDENT_AMBULATORY_CARE_PROVIDER_SITE_OTHER): Payer: 59

## 2017-01-06 ENCOUNTER — Ambulatory Visit (INDEPENDENT_AMBULATORY_CARE_PROVIDER_SITE_OTHER): Payer: 59 | Admitting: Psychiatry

## 2017-01-06 ENCOUNTER — Encounter (HOSPITAL_COMMUNITY): Payer: Self-pay | Admitting: Psychiatry

## 2017-01-06 DIAGNOSIS — Z87891 Personal history of nicotine dependence: Secondary | ICD-10-CM

## 2017-01-06 DIAGNOSIS — G4700 Insomnia, unspecified: Secondary | ICD-10-CM

## 2017-01-06 DIAGNOSIS — F250 Schizoaffective disorder, bipolar type: Secondary | ICD-10-CM

## 2017-01-06 MED ORDER — ARIPIPRAZOLE ER 400 MG IM PRSY
400.0000 mg | PREFILLED_SYRINGE | Freq: Once | INTRAMUSCULAR | Status: AC
  Administered 2017-01-06: 400 mg via INTRAMUSCULAR

## 2017-01-06 MED ORDER — ARIPIPRAZOLE 2 MG PO TABS: 2.0000 mg | ORAL_TABLET | Freq: Every day | ORAL | 0 refills | Status: DC

## 2017-01-06 NOTE — Progress Notes (Signed)
Patient presented today with flat affect and appropriate mood for his injection of Abilify Maintena 400 mg. Patient is still waiting on funding from [employer] and was given a sample today. Patient states his arm was a little sore after last injection, but he would still rather get it in the arm. Injection of Abilify Maintena 400 mg was prepared as ordered and given to patient in right deltoid. Patient tolerated the procedure well and without complaint and will follow up in 30 days.

## 2017-01-06 NOTE — Progress Notes (Signed)
BH MD/PA/NP OP Progress Note  01/06/2017 8:49 AM Kevin Deleon  MRN:  [id number]  Chief Complaint:  Subjective:  I like Abilify.  HPI: Kevin Deleon came for his follow-up appointment with his mother.  He is getting Abilify injection and today he will get third injection.  He like Abilify.  He is more vocal and active but sometime he has difficulty sleeping.  He remember especially postdialysis he is very tired and restless but he likes trazodone which is helping his insomnia.  He get some time irritable and frustrated due to dialysis, occasions but overall his mood is been stable.  He denies any paranoia, hallucination, psychosis or any suicidal thoughts.  He likes to continue oral Abilify for a little while because he believed it is helping.  He has no tremors, shakes or any EPS.  He cut down his smoking and tried to be on a transplant list which requires no smoking and compliance with diet.  Patient denies drinking alcohol or using any illegal substances.  His energy level is improved with Abilify.  His vital signs are stable.  Visit Diagnosis:    ICD-10-CM   1. Schizoaffective disorder, bipolar type ([place]) F25.0 ARIPiprazole (ABILIFY) 2 MG tablet    Past Psychiatric History: Reviewed. Patient has psychiatric illness since age 46. He has paranoia, mania, psychosis and disorganized thinking. He has at least 5 psychiatric hospitalization mostly due to noncompliance with medication. He was seeing [facility] mental health for many years. In the past he had tried Haldol which he actually likes but when the dose was increased to 15 mg he became zombie and stopped. He has taken lithium in the past this was stopped because of renal insufficiency. In the past he had tried amitriptyline given by Dr. Tamala Julian and Risperdal but switched to Abilify because of better compliance.    Past Medical History:  Past Medical History:  Diagnosis Date  . Chronic kidney disease    dialysis Tue-Thur-Sat  . Diabetes  mellitus ([place])   . Dialysis patient ([place])    T, TH, SAT at [facility]  . GERD (gastroesophageal reflux disease)   . History of radiation therapy    supraglottis  . HTN (hypertension)    off BP meds since dialysis started 03-2016  . Hyperlipidemia   . Liver disease    patient denied 08/26/16  . Mental disorder    Schizo- affective disorder  . Schizophrenia ([place])    schizo-affective  . Smoker    quit one week ago  . Smokers' cough [facility])     Past Surgical History:  Procedure Laterality Date  . AV FISTULA PLACEMENT Left 05/24/2013   Procedure: ARTERIOVENOUS (AV) FISTULA CREATION BRACHIAL-CEPHALIC;  Surgeon: Conrad [place], MD;  Location: [place];  Service: Vascular;  Laterality: Left;  . DIRECT LARYNGOSCOPY N/A 08/17/2016   Procedure: DIRECT LARYNGOSCOPY WITH BIOPSY AND FROZEN SECTION;  Surgeon: Rozetta Nunnery, MD;  Location: [place];  Service: ENT;  Laterality: N/A;  . MULTIPLE EXTRACTIONS WITH ALVEOLOPLASTY N/A 08/31/2016   Procedure: Extraction of tooth #'s 2-10, 13-15, and 19-30 with alveoloplasty;  Surgeon: Lenn Cal, DDS;  Location: [place];  Service: Oral Surgery;  Laterality: N/A;    Family Psychiatric History: Reviewed.  Family History:  Family History  Problem Relation Age of Onset  . Cancer - Lung Other   . Cancer Other     Social History:  Social History   Social History  . Marital status: Single  Spouse name: N/A  . Number of children: N/A  . Years of education: N/A   Social History Main Topics  . Smoking status: Former Smoker    Packs/day: 1.00    Years: 20.00    Types: Cigarettes    Quit date: 08/12/2016  . Smokeless tobacco: Never Used  . Alcohol use No     Comment: occ  . Drug use: No  . Sexual activity: Not on file   Other Topics Concern  . Not on file   Social History Narrative  . No narrative on file    Allergies: No Known Allergies  Metabolic Disorder Labs: Lab Results  Component Value Date   HGBA1C  7.1 (H) 08/26/2016   MPG 157 08/26/2016   No results found for: PROLACTIN No results found for: CHOL, TRIG, HDL, CHOLHDL, VLDL, LDLCALC   Current Medications: Current Outpatient Prescriptions  Medication Sig Dispense Refill  . ARIPiprazole (ABILIFY) 10 MG tablet Take 1 tablet (10 mg total) by mouth daily. 30 tablet 1  . glipiZIDE (GLUCOTROL) 5 MG tablet Take 5 mg by mouth daily at 12 noon.     Marland Kitchen LANTUS SOLOSTAR 100 UNIT/ML injection Inject 28 Units into the skin at bedtime.     . nicotine (NICODERM CQ - DOSED IN MG/24 HOURS) 21 mg/24hr patch Place 21 mg onto the skin daily.    . ONE TOUCH ULTRA TEST test strip 1 each by Other route every evening.     . simvastatin (ZOCOR) 20 MG tablet Take 20 mg by mouth daily at 12 noon.     . traZODone (DESYREL) 50 MG tablet Take 1 - 2 tablets at bedtime as needed for sleep 180 tablet 0   No current facility-administered medications for this visit.     Neurologic: Headache: No Seizure: No Paresthesias: No  Musculoskeletal: Strength & Muscle Tone: within normal limits Gait & Station: normal Patient leans: N/A  Psychiatric Specialty Exam: ROS  There were no vitals taken for this visit.There is no height or weight on file to calculate BMI.  General Appearance: Casual  Eye Contact:  Good  Speech:  Slow  Volume:  Normal  Mood:  Euthymic  Affect:  Appropriate  Thought Process:  Goal Directed  Orientation:  Full (Time, Place, and Person)  Thought Content: Logical   Suicidal Thoughts:  No  Homicidal Thoughts:  No  Memory:  Immediate;   Good Recent;   Good Remote;   Good  Judgement:  Good  Insight:  Good  Psychomotor Activity:  Normal  Concentration:  Concentration: Fair and Attention Span: Fair  Recall:  [place] of Knowledge: Good  Language: Good  Akathisia:  No  Handed:  Right  AIMS (if indicated):  0  Assets:  Communication Skills Desire for Improvement Housing Resilience Social Support  ADL's:  Intact  Cognition: WNL   Sleep:  ok    Assessment: Schizophrenia chronic paranoid type  Plan: Patient doing better on Abilify injection.  Recommended to reduce oral Abilify and take 2 mg for another 30 days and then discontinue as patient will get Abilify injection 400 mg intramuscular every 4 weeks.  Discussed medication side effects and benefits.  Encourage improve compliance.  His diet and a stop smoking and try to get on transplant list.  Continue trazodone 50 mg at bedtime which is helping his insomnia.  Patient is not interested in counseling.  Recommended to call us back if he has any question or any concern.  Follow-up in 3  months.  Vy Badley T., MD 01/06/2017, 8:49 AM

## 2017-01-20 ENCOUNTER — Telehealth (HOSPITAL_COMMUNITY): Payer: Self-pay

## 2017-01-20 ENCOUNTER — Other Ambulatory Visit (HOSPITAL_COMMUNITY): Payer: Self-pay | Admitting: Psychiatry

## 2017-01-20 NOTE — Telephone Encounter (Signed)
Patients mother calling, she states that the patient is not sleeping - the Trazodone is not helping. She would like to know what you suggest. Please review and advise, thank you

## 2017-01-21 NOTE — Telephone Encounter (Signed)
He can try trazodone 100 mg at bedtime.  If not relieved than he can try Vistaril 25 mg 1-2 capsule at bedtime.

## 2017-01-22 MED ORDER — HYDROXYZINE PAMOATE 25 MG PO CAPS: 25.0000 mg | ORAL_CAPSULE | Freq: Every day | ORAL | 2 refills | Status: DC

## 2017-01-22 NOTE — Telephone Encounter (Signed)
I called patients mother and she said that he has been taking 100 mg Trazodone for about a week and it has not helped. I sent in the rx for Vistaril and she will let me know next week if it is helping.

## 2017-01-27 ENCOUNTER — Other Ambulatory Visit (HOSPITAL_COMMUNITY): Payer: Self-pay | Admitting: Psychiatry

## 2017-01-28 ENCOUNTER — Other Ambulatory Visit (HOSPITAL_COMMUNITY): Payer: Self-pay | Admitting: Psychiatry

## 2017-01-28 NOTE — Telephone Encounter (Signed)
Patient is stopped taking because it was not helping him.

## 2017-02-01 ENCOUNTER — Ambulatory Visit (HOSPITAL_COMMUNITY): Payer: 59

## 2017-02-01 ENCOUNTER — Telehealth (HOSPITAL_COMMUNITY): Payer: Self-pay

## 2017-02-01 NOTE — Telephone Encounter (Signed)
I called patients mother to let her know that we could not do Kevin Deleon's injection today due to not having samples at the hospital. She said that she was going to talk to me today about the injection not lasting, she does not think it is lasting a whole month and the patient has been taking the 2 mg tablet everyday as well. She said she put in a request for the Abilify 10 mg from the pharmacy so she could have some if they needed it. She also expressed concern over the Trazodone, she said that patient told her it keeps him up so she would like to d/c the Trazodone and go up on the hydroxyzine - from 25 mg qhs to 50 mg qhs. Please review and advise, thank you

## 2017-02-02 ENCOUNTER — Other Ambulatory Visit (HOSPITAL_COMMUNITY): Payer: Self-pay

## 2017-02-02 MED ORDER — ARIPIPRAZOLE 10 MG PO TABS: 10.0000 mg | ORAL_TABLET | Freq: Every day | ORAL | 2 refills | Status: DC

## 2017-02-02 NOTE — Telephone Encounter (Signed)
If he is unable to get Abilify injection than he will take Abilify 10 mg by mouth every day.  Discontinue trazodone and resume Vistaril 25 mg 1-2 capsule as needed for insomnia.

## 2017-02-02 NOTE — Telephone Encounter (Signed)
I called patients mother and let her know to d/c the Trazodone and that I was sending the Abilify 10 mg to the pharmacy

## 2017-02-03 ENCOUNTER — Telehealth (HOSPITAL_COMMUNITY): Payer: Self-pay | Admitting: Psychiatry

## 2017-02-08 ENCOUNTER — Ambulatory Visit (HOSPITAL_COMMUNITY): Payer: Self-pay

## 2017-02-09 ENCOUNTER — Telehealth (HOSPITAL_COMMUNITY): Payer: Self-pay

## 2017-02-09 DIAGNOSIS — F250 Schizoaffective disorder, bipolar type: Secondary | ICD-10-CM

## 2017-02-09 MED ORDER — ARIPIPRAZOLE 10 MG PO TABS: 10.0000 mg | ORAL_TABLET | Freq: Every day | ORAL | 0 refills | Status: DC

## 2017-02-09 NOTE — Telephone Encounter (Signed)
Medication management - Met with Dr. Arfeen about continued problems waiting on samples of Abilify Maintena and called patient's Mother to discuss as he is now taking Abilify 10 mg daily.  Informed Ms. Hackney the Abilify Maintena was still on backorder and we were now being told it may be in by Friday 02/12/17.  Questioned how patient was doing with po Abilify 10 mg one a day as there was initial concern with patient having radiation treatments that he may not be able swallow but is still doing fine with this and no current problems taking medications daily per his Mother's report.  Collateral stated she was not sure how he would do just being on the po medication long-term but for now was doing better with brighter affect, less sluggish and no complaints.  Agreed for now patient will remain on po Abilify daily and can revisit need for monthly injections if he starts to have any increased symptoms.  Collateral agreed with plan and will keep us informed.  Agreed to see if Dr. Arfeen would authorize a 90 day order of Abilify 10 mg to send to [facility] as this would be cheaper for patient than getting monthly locally.  Will call collateral back once new order has been placed.  

## 2017-02-09 NOTE — Telephone Encounter (Signed)
Okay to 90 day supply

## 2017-02-09 NOTE — Telephone Encounter (Signed)
Medication management - Telephone call back with patient's Mother to inform Dr. Adele Schilder authorized a new 90 day order for patient's Abilify 10 mg, one daily and a new order was being sent o [facility] this date as requested. Patient's Mother or patient will call back if any problems with symptoms due to transitioning off injection Abilify Maintena and now just going to be taking po Abilify 10 mg a day.  A new order for patient's Abilify 10 mg, one a day, #90 with no refills e-scribed to patient's [facility] pharmacy as was approved by Dr. Adele Schilder this date and plan for patient at this time to stay on po medication unless any problems with symptoms return now that he is off injection Abilify Maintena.

## 2017-02-10 ENCOUNTER — Ambulatory Visit: Payer: Medicare Other

## 2017-02-10 DIAGNOSIS — Z79899 Other long term (current) drug therapy: Secondary | ICD-10-CM | POA: Insufficient documentation

## 2017-02-10 DIAGNOSIS — K219 Gastro-esophageal reflux disease without esophagitis: Secondary | ICD-10-CM | POA: Insufficient documentation

## 2017-02-10 DIAGNOSIS — Z510 Encounter for antineoplastic radiation therapy: Secondary | ICD-10-CM | POA: Insufficient documentation

## 2017-02-10 DIAGNOSIS — R918 Other nonspecific abnormal finding of lung field: Secondary | ICD-10-CM | POA: Insufficient documentation

## 2017-02-10 DIAGNOSIS — C321 Malignant neoplasm of supraglottis: Secondary | ICD-10-CM | POA: Insufficient documentation

## 2017-02-10 DIAGNOSIS — E785 Hyperlipidemia, unspecified: Secondary | ICD-10-CM | POA: Insufficient documentation

## 2017-02-10 DIAGNOSIS — F259 Schizoaffective disorder, unspecified: Secondary | ICD-10-CM | POA: Insufficient documentation

## 2017-02-10 DIAGNOSIS — I129 Hypertensive chronic kidney disease with stage 1 through stage 4 chronic kidney disease, or unspecified chronic kidney disease: Secondary | ICD-10-CM | POA: Insufficient documentation

## 2017-02-10 DIAGNOSIS — K769 Liver disease, unspecified: Secondary | ICD-10-CM | POA: Insufficient documentation

## 2017-02-10 DIAGNOSIS — Z992 Dependence on renal dialysis: Secondary | ICD-10-CM | POA: Insufficient documentation

## 2017-02-10 DIAGNOSIS — E1122 Type 2 diabetes mellitus with diabetic chronic kidney disease: Secondary | ICD-10-CM | POA: Insufficient documentation

## 2017-02-10 DIAGNOSIS — Z87891 Personal history of nicotine dependence: Secondary | ICD-10-CM | POA: Insufficient documentation

## 2017-02-10 DIAGNOSIS — Z794 Long term (current) use of insulin: Secondary | ICD-10-CM | POA: Insufficient documentation

## 2017-02-10 DIAGNOSIS — N189 Chronic kidney disease, unspecified: Secondary | ICD-10-CM | POA: Insufficient documentation

## 2017-02-11 ENCOUNTER — Ambulatory Visit: Payer: Medicare Other

## 2017-02-12 ENCOUNTER — Inpatient Hospital Stay: Admission: RE | Admit: 2017-02-12 | Payer: Self-pay | Source: Ambulatory Visit | Admitting: Radiation Oncology

## 2017-02-15 ENCOUNTER — Ambulatory Visit (HOSPITAL_COMMUNITY)
Admission: RE | Admit: 2017-02-15 | Discharge: 2017-02-15 | Disposition: A | Discharge status: Home / Self Care | Payer: Medicare Other | Source: Ambulatory Visit | Attending: Radiation Oncology | Admitting: Radiation Oncology

## 2017-02-15 ENCOUNTER — Ambulatory Visit
Admission: RE | Admit: 2017-02-15 | Discharge: 2017-02-15 | Disposition: A | Discharge status: Home / Self Care | Payer: Medicare Other | Source: Ambulatory Visit | Attending: Radiation Oncology | Admitting: Radiation Oncology

## 2017-02-15 DIAGNOSIS — F17210 Nicotine dependence, cigarettes, uncomplicated: Secondary | ICD-10-CM | POA: Insufficient documentation

## 2017-02-15 DIAGNOSIS — Z794 Long term (current) use of insulin: Secondary | ICD-10-CM | POA: Insufficient documentation

## 2017-02-15 DIAGNOSIS — C321 Malignant neoplasm of supraglottis: Secondary | ICD-10-CM | POA: Insufficient documentation

## 2017-02-15 DIAGNOSIS — K5792 Diverticulitis of intestine, part unspecified, without perforation or abscess without bleeding: Secondary | ICD-10-CM | POA: Insufficient documentation

## 2017-02-15 DIAGNOSIS — I700 Atherosclerosis of aorta: Secondary | ICD-10-CM | POA: Insufficient documentation

## 2017-02-15 DIAGNOSIS — I6389 Other cerebral infarction: Secondary | ICD-10-CM | POA: Diagnosis not present

## 2017-02-15 DIAGNOSIS — Z79899 Other long term (current) drug therapy: Secondary | ICD-10-CM | POA: Insufficient documentation

## 2017-02-15 DIAGNOSIS — Z8521 Personal history of malignant neoplasm of larynx: Secondary | ICD-10-CM | POA: Insufficient documentation

## 2017-02-15 DIAGNOSIS — Z9889 Other specified postprocedural states: Secondary | ICD-10-CM | POA: Insufficient documentation

## 2017-02-15 LAB — TSH: TSH: 0.978 m(IU)/L (ref 0.320–4.118)

## 2017-02-15 LAB — GLUCOSE, CAPILLARY: GLUCOSE-CAPILLARY: 107 mg/dL — AB (ref 65–99)

## 2017-02-15 MED ORDER — FLUDEOXYGLUCOSE F - 18 (FDG) INJECTION
11.2000 | Freq: Once | INTRAVENOUS | Status: AC | PRN
  Administered 2017-02-15: 11.2 via INTRAVENOUS

## 2017-02-16 ENCOUNTER — Encounter: Payer: Self-pay | Admitting: Radiation Oncology

## 2017-02-17 NOTE — Progress Notes (Signed)
Mr. Rochford presents for follow up of radiation completed 11/03/16 to his Supraglottis.  Pain issues, if any: He denies. He does have occasional leg cramps.  Using a feeding tube?: No Weight changes, if any:  Wt Readings from Last 3 Encounters:  02/19/17 221 lb 12.8 oz (100.6 kg)  12/07/16 225 lb (102.1 kg)  11/16/16 225 lb (102.1 kg)   Swallowing issues, if any: He denies. He does not have teeth which limits his food intake.  Smoking or chewing tobacco? He is smoking about 2 cigarettes daily.  Using fluoride trays daily? no Last ENT visit was on: Not since diagnosis.  Other notable issues, if any:  PET 02/15/17 He had a flu shot yesterday.

## 2017-02-19 ENCOUNTER — Telehealth: Payer: Self-pay | Admitting: *Deleted

## 2017-02-19 ENCOUNTER — Encounter: Payer: Self-pay | Admitting: Radiation Oncology

## 2017-02-19 ENCOUNTER — Ambulatory Visit
Admission: RE | Admit: 2017-02-19 | Discharge: 2017-02-19 | Disposition: A | Discharge status: Home / Self Care | Payer: Medicare Other | Source: Ambulatory Visit | Attending: Radiation Oncology | Admitting: Radiation Oncology

## 2017-02-19 ENCOUNTER — Encounter: Payer: Self-pay | Admitting: *Deleted

## 2017-02-19 VITALS — BP 127/88 | HR 90 | Temp 98.2°F | Ht 70.5 in | Wt 221.8 lb

## 2017-02-19 DIAGNOSIS — Z79899 Other long term (current) drug therapy: Secondary | ICD-10-CM | POA: Diagnosis not present

## 2017-02-19 DIAGNOSIS — F17210 Nicotine dependence, cigarettes, uncomplicated: Secondary | ICD-10-CM | POA: Diagnosis not present

## 2017-02-19 DIAGNOSIS — K639 Disease of intestine, unspecified: Secondary | ICD-10-CM | POA: Insufficient documentation

## 2017-02-19 DIAGNOSIS — C321 Malignant neoplasm of supraglottis: Secondary | ICD-10-CM | POA: Diagnosis present

## 2017-02-19 DIAGNOSIS — Z9889 Other specified postprocedural states: Secondary | ICD-10-CM | POA: Diagnosis not present

## 2017-02-19 DIAGNOSIS — Z794 Long term (current) use of insulin: Secondary | ICD-10-CM | POA: Diagnosis not present

## 2017-02-19 NOTE — Progress Notes (Signed)
Oncology Nurse Navigator Documentation  Met with Kevin Deleon   He completed tmt for supraglottic cancer 11/03/16.  He was accompanied by his mother and step-father. They voiced understanding of:  Favorable results of 02/15/17 PET.  I will facilitate follow-up with ENT Dr. Lucia Gaskins next available appt at which time laryngoscopy will be conducted as further confirmation of treatment response. He acknowledged still smoking cigarettes, 1-2/day.  Agreed to think about setting quit date per Dr. Pearlie Oyster encouragment. They were encouraged to contact me with needs/concerns.  Gayleen Orem, RN, BSN, [place] [facility] Oncology Nurse [place] at [facility] [phone]

## 2017-02-19 NOTE — Telephone Encounter (Signed)
Oncology Nurse Navigator Documentation  Per Dr. Pearlie Oyster guidance, called ENT Dr. Pollie Friar office to arrange appt.   Spoke with Gay Filler, requested next available appt with Dr. Lucia Gaskins, asked that patient's mother be contacted and appt arranged.  She verbalized understanding.  Gayleen Orem, RN, BSN, [place] [facility] Oncology Nurse [place] at [place] [phone]

## 2017-02-19 NOTE — Progress Notes (Signed)
Radiation Oncology         [phone] ________________________________  Name: Kevin Deleon MRN: [id number]  Date: 02/19/2017  DOB: 01-Jul-1970  Follow-Up Visit Note  CC: Patient, No Pcp Per  Rozetta Nunnery, *  Diagnosis and Prior Radiotherapy:       ICD-10-CM   1. 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 Marked improvement in activity at the site of the prior supraglottic cancer. There is indistinctness of tissue planes in this vicinity which is likely treatment related, but maximum SUV is now only 4.3 (previously 23.4). No findings of metastatic disease. 2. Focal hypermetabolic activity in the descending duodenum, without CT correlate, but persistent from previous. All possibly inflammatory or physiologic I cannot exclude a polyp in this vicinity, and upper endoscopy may be warranted. 3. Mild acute diverticulitis, with a focally inflamed diverticulum along the sigmoid colon with associated hypermetabolic activity. No extraluminal gas or abscess. 4. Remote right caudate head infarct intracranially. 5.  Aortic Atherosclerosis (ICD10-I70.0). 6. A 3 by 4 mm nodule along the right minor fissure and average size 4 mm nodule along the left major fissure, night there is hypermetabolic and both are below sensitive PET-CT size thresholds. No change in size of either nodule.  The patient returns today for routine follow-up with his parents. Doing well overall. Denies new issues and patient/family feel he is recovering well. Patient and his parents are here to day to discuss the results of the recent PET scan. He needs a follow up with Dr. Lucia Gaskins for an laryngoscopy. Patient states he is currently smoking only 2  cigarettes daily and is motivated to quit but is not ready to set a quit date.          ALLERGIES:  has No Known Allergies.  Meds: Current Outpatient Prescriptions  Medication Sig Dispense Refill  . ARIPiprazole (ABILIFY) 10 MG tablet Take 1 tablet (10 mg total) by mouth daily. 90 tablet 0  . glipiZIDE (GLUCOTROL) 5 MG tablet Take 5 mg by mouth daily at 12 noon.     . ONE TOUCH ULTRA TEST test strip 1 each by Other route every evening.     . simvastatin (ZOCOR) 20 MG tablet Take 20 mg by mouth daily at 12 noon.     . hydrOXYzine (VISTARIL) 25 MG capsule Take 1 capsule (25 mg total) by mouth at bedtime. (Patient not taking: Reported on 02/19/2017) 30 capsule 2  . LANTUS SOLOSTAR 100 UNIT/ML injection Inject 28 Units into the skin at bedtime.     . nicotine (NICODERM CQ - DOSED IN MG/24 HOURS) 21 mg/24hr patch Place 21 mg onto the skin daily.    . traZODone (DESYREL) 50 MG tablet Take 1 - 2 tablets at bedtime as needed for sleep (Patient not taking: Reported on 02/19/2017) 180 tablet 0   No current facility-administered medications for this encounter.     Physical Findings: The patient is in no acute distress. Patient is alert and oriented. Wt Readings from Last 3 Encounters:  02/19/17 221 lb 12.8 oz (100.6 kg)  12/07/16 225 lb (102.1 kg)  11/16/16 225 lb (102.1 kg)    height is 5' 10.5" (1.791 m) and weight is 221  lb 12.8 oz (100.6 kg). His temperature is 98.2 F (36.8 C). His blood pressure is 127/88 and his pulse is 90. His oxygen saturation is 97%. .  General: Alert and oriented, in no acute distress HEENT: Head is normocephalic. Extraocular movements are intact. Oropharynx / mouth is notable for tan coating on tongue consistent with resolving mucositis.  Otherwise unremarkable Neck: Neck is supple, no palpable cervical or supraclavicular lymphadenopathy. Heart: Regular in rate and rhythm with no murmurs, rubs, or gallops. Chest: Clear to auscultation bilaterally, with no rhonchi,  wheezes, or rales. Skin: Skin in treatment fields shows satisfactory healing over neck.  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 EXAM: NUCLEAR MEDICINE PET SKULL BASE TO THIGH TECHNIQUE: 11.2 mCi F-18 FDG was injected intravenously. Full-ring PET imaging was performed from the skull base to thigh after the radiotracer. CT data was obtained and used for attenuation correction and anatomic localization. FASTING BLOOD GLUCOSE:  Value: 107 mg/dl COMPARISON:  Prior PET-CT 08/19/2016 FINDINGS: NECK Remote focal infarct involving the right caudate head and adjacent internal capsule, images 5-6 series 4. The Poor delineation of tissue planes in the vicinity of the prior supraglottic cancer, but with marked improvement and metabolic activity in this region. Epiglottic hypermetabolic activity was previously with an SUV of 23.4 by maximum SUV in this region currently is only 4.3. No hypermetabolic or enlarged lymph nodes in the neck. No other significant abnormal regional hypermetabolic activity. CHEST No hypermetabolic mediastinal or hilar nodes. Along the minor fissure there is a 3 by 4 mm nodule which is not discernibly hypermetabolic. Along the left lower lobe there is a 0.5 by 0.3 cm pulmonary nodule on image 31/8 which is stable in size and not hypermetabolic. No new nodule identified. Atherosclerotic calcification of the aortic arch and branch vessels. ABDOMEN/PELVIS No abnormal hypermetabolic activity within the liver, pancreas, adrenal glands, or spleen. No hypermetabolic lymph nodes in the abdomen or pelvis. There continues to be some focal  accentuated metabolic activity in the descending duodenum, maximum SUV 7.1, formerly 7.4. No obvious surrounding inflammatory stranding. No discrete CT correlate. Bilateral renal atrophy. Several punctate calcifications along the left renal parenchyma. Mild low-density fullness of the left adrenal gland. Aortoiliac atherosclerotic vascular disease. There is inflammatory stranding about the sigmoid colon adjacent to what appears to be an inflamed diverticulum, images 176-183 series 4. There is some focal hypermetabolic activity in this region which is new and measures 7.7 SUV. SKELETON No focal hypermetabolic activity to suggest skeletal metastasis. IMPRESSION: 1. Marked improvement in activity at the site of the prior supraglottic cancer. There is indistinctness of tissue planes in this vicinity which is likely treatment related, but maximum SUV is now only 4.3 (previously 23.4). No findings of metastatic disease. 2. Focal hypermetabolic activity in the descending duodenum, without CT correlate, but persistent from previous. All possibly inflammatory or physiologic I cannot exclude a polyp in this vicinity, and upper endoscopy may be warranted. 3. Mild acute diverticulitis, with a focally inflamed diverticulum along the sigmoid colon with associated hypermetabolic activity. No extraluminal gas or abscess. 4. Remote right caudate head  infarct intracranially. 5.  Aortic Atherosclerosis (ICD10-I70.0). 6. A 3 by 4 mm nodule along the right minor fissure and average size 4 mm nodule along the left major fissure, night there is hypermetabolic and both are below sensitive PET-CT size thresholds. No change in size of either nodule. Electronically Signed   By: Van Clines M.D.   On: 02/15/2017 16:26    Impression/Plan:    1) Head and Neck Cancer Status: healing well - PET response - needs to see ENT soon for laryngoscopy to confirm if NED - will refer back  2) Nutritional Status: stable  PEG tube: none  3)  Risk Factors: The patient has been educated about risk factors including alcohol and tobacco abuse; they understand that avoidance of alcohol and tobacco is important to prevent recurrences as well as other cancers. Patient is currently smoking 2 cigarettes daily and is motivated to quit but is not ready to set a quit date.            4) Swallowing: functional; patient does not have teeth which limits his food intake.  5) Dental: edentulous  6) Thyroid function: wnl Lab Results  Component Value Date   TSH 0.978 02/15/2017     7) Other: Recommended heart healthy diet with increase in exercise. No other issues; family informed they should reschedule SLP appt to prevent long term dysphagia  8) Follow-up  3 months . The patient and family were encouraged to call with any issues or questions before then. Will refer to gastroenterology for follow up of GI remarks in PET scan, consider endoscopy.    __________________________________   Eppie Gibson, MD  This document serves as a record of services personally performed by Eppie Gibson, MD, MD. It was created on her behalf by Marlowe Kays, a trained medical scribe. The creation of this record is based on the scribe's personal observations and the provider's statements to them. This document has been checked and approved by the attending provider.

## 2017-02-22 ENCOUNTER — Ambulatory Visit (INDEPENDENT_AMBULATORY_CARE_PROVIDER_SITE_OTHER): Payer: Medicare Other

## 2017-02-22 ENCOUNTER — Telehealth: Payer: Self-pay | Admitting: *Deleted

## 2017-02-22 ENCOUNTER — Telehealth (HOSPITAL_COMMUNITY): Payer: Self-pay

## 2017-02-22 DIAGNOSIS — F250 Schizoaffective disorder, bipolar type: Secondary | ICD-10-CM

## 2017-02-22 MED ORDER — ARIPIPRAZOLE ER 400 MG IM PRSY
400.0000 mg | PREFILLED_SYRINGE | INTRAMUSCULAR | Status: DC
  Administered 2017-02-22: 400 mg via INTRAMUSCULAR

## 2017-02-22 NOTE — Telephone Encounter (Signed)
Patients mother is calling, he is going to get injection today - his mother would like him to also stay on the tablets of Abilify 10 mg because she feels like the injection does not last long enough and patient looses medication during dialysis. Please review and advise, thank you

## 2017-02-22 NOTE — Telephone Encounter (Signed)
If he prefers oral Abilify then we will continue Abilify 10 mg by mouth.  Discontinue Abilify injection.

## 2017-02-22 NOTE — Telephone Encounter (Signed)
CALLED PATIENT TO INFORM OF GI APPT. ON 02-24-17 - ARRIVAL TIME - 1 PM @ [facility], ADDRESS- [address], APPT. WITH PA JENNIFER LEMONS, LVM FOR A RETURN CALL

## 2017-02-22 NOTE — Progress Notes (Signed)
Patient presented today with flat affect and appropriate mood for his injection of Abilify Maintena 400 mg. Patient is still waiting on funding from [employer] and was given a sample today. Patient states his arm was a little sore after last injection, but he would still rather get it in the arm. Injection of Abilify Maintena 400 mg was prepared as ordered and given to patient in right deltoid. Patients mother is present and has concerns that this medication is being taken out by dialysis. I have sent a message to Dr. Adele Schilder. Patient tolerated the procedure well and without complaint and will follow up in 30 days.

## 2017-02-23 ENCOUNTER — Ambulatory Visit: Payer: Self-pay | Admitting: Physician Assistant

## 2017-02-24 ENCOUNTER — Ambulatory Visit: Payer: Self-pay | Admitting: Physician Assistant

## 2017-03-05 ENCOUNTER — Ambulatory Visit: Payer: Self-pay | Admitting: Physician Assistant

## 2017-03-08 ENCOUNTER — Telehealth (HOSPITAL_COMMUNITY): Payer: Self-pay

## 2017-03-08 DIAGNOSIS — F250 Schizoaffective disorder, bipolar type: Secondary | ICD-10-CM

## 2017-03-08 NOTE — Telephone Encounter (Signed)
Patients mother is calling, she said that the injection is not working for the entire month, she has been giving him the tablets as well on the days that he has dialysis. She said that seems to keep him more level. Please review and advise, thank you

## 2017-03-09 NOTE — Telephone Encounter (Signed)
.    He can switch completely to oral Abilify and a stop injection.

## 2017-03-12 MED ORDER — ARIPIPRAZOLE 15 MG PO TABS: 15.0000 mg | ORAL_TABLET | Freq: Every day | ORAL | 0 refills | Status: DC

## 2017-03-12 NOTE — Telephone Encounter (Signed)
I spoke with patients mother and she is not happy with having to stop the injection. I explained to her he could not be on both. She inquired about a dose increase and I spoke to Dr. Adele Schilder. He agreed to increase to 15 mg. I sent a new prescription to the pharmacy and let patients mother know.

## 2017-03-22 ENCOUNTER — Ambulatory Visit (INDEPENDENT_AMBULATORY_CARE_PROVIDER_SITE_OTHER): Payer: Medicare Other | Admitting: Psychiatry

## 2017-03-22 ENCOUNTER — Ambulatory Visit (HOSPITAL_COMMUNITY): Payer: Self-pay

## 2017-03-22 ENCOUNTER — Encounter (HOSPITAL_COMMUNITY): Payer: Self-pay | Admitting: Psychiatry

## 2017-03-22 VITALS — BP 130/86 | HR 100 | Ht 70.5 in | Wt 218.0 lb

## 2017-03-22 DIAGNOSIS — F17210 Nicotine dependence, cigarettes, uncomplicated: Secondary | ICD-10-CM

## 2017-03-22 DIAGNOSIS — F39 Unspecified mood [affective] disorder: Secondary | ICD-10-CM | POA: Diagnosis not present

## 2017-03-22 DIAGNOSIS — G4700 Insomnia, unspecified: Secondary | ICD-10-CM

## 2017-03-22 DIAGNOSIS — F22 Delusional disorders: Secondary | ICD-10-CM | POA: Diagnosis not present

## 2017-03-22 DIAGNOSIS — F250 Schizoaffective disorder, bipolar type: Secondary | ICD-10-CM

## 2017-03-22 DIAGNOSIS — R454 Irritability and anger: Secondary | ICD-10-CM

## 2017-03-22 MED ORDER — ARIPIPRAZOLE 20 MG PO TABS: 20.0000 mg | ORAL_TABLET | Freq: Every day | ORAL | 1 refills | Status: DC

## 2017-03-22 NOTE — Progress Notes (Signed)
[facility] MD/PA/NP OP Progress Note  03/22/2017 12:39 PM Kevin Deleon  MRN:  [id number]  Chief Complaint:  I do not think an injection is working.  I still have a lot of anger.  HPI: The patient came for his follow-up appointment with his mother.  He is taking Abilify by mouth because injection did not work for him.  He believes injection is cleared from dialysis.  He admitted having irritability, anger, mood swing and paranoia.  Otherwise his physical health is getting much better.  He lost weight and he is no longer taking medicine for diabetes.  His last hemoglobin A1c was 5.6 which was done at Dr. Tenny Craw office.  Sometimes he had insomnia and we have tried trazodone and Vistaril but that did not help him.  He like to try a higher dose of Abilify.  He has no shakes, tremors or any EPS.  He is getting dialysis Tuesday Thursday and Saturday.  He is not on transplant list but hoping to be on list once he is cancer free for more than a year.  His throat cancer is stable.  He admitted continues to smoke 4-5 cigarettes a week.  He denies any illegal substance use.  He does not want to change his antipsychotic because Abilify helping.  However he like to see if the dose can further increase.  Visit Diagnosis:    ICD-10-CM   1. Schizoaffective disorder, bipolar type ([place]) F25.0     Past Psychiatric History: Reviewed Patient has psychiatric illness since age 89. He has paranoia, mania, psychosis and disorganized thinking. He has at least 5 psychiatric hospitalization mostly due to noncompliance with medication. He was seeing [facility] mental health for many years. In the past he had tried Haldol which he actually likes but when the dose was increased to 15 mg he became zombie and stopped. He has taken lithium in the past this was stopped because of renal insufficiency. In the past he had tried amitriptyline given by Dr. Tamala Julian and Risperdal but switched to Abilify because of better compliance.   Past  Medical History:  Past Medical History:  Diagnosis Date  . Chronic kidney disease    dialysis Tue-Thur-Sat  . Diabetes mellitus ([place])   . Dialysis patient ([place])    T, TH, SAT at [facility]  . GERD (gastroesophageal reflux disease)   . 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 HTN (hypertension)    off BP meds since dialysis started 03-2016  . Hyperlipidemia   . Liver disease    patient denied 08/26/16  . Mental disorder    Schizo- affective disorder  . Schizophrenia ([place])    schizo-affective  . Smoker    quit one week ago  . Smokers' cough ([place])    No past surgical history on file.  Family Psychiatric History: Reviewed.  Family History:  Family History  Problem Relation Age of Onset  . Cancer - Lung Other   . Cancer Other     Social History:  Social History   Socioeconomic History  . Marital status: Single    Spouse name: Not on file  . Number of children: Not on file  . Years of education: Not on file  . Highest education level: Not on file  Social Needs  . Financial resource strain: Not on file  . Food insecurity - worry: Not on file  . Food insecurity - inability: Not on file  . Transportation needs - medical: Not on  file  . Transportation needs - non-medical: Not on file  Occupational History  . Not on file  Tobacco Use  . Smoking status: Current Some Day Smoker    Packs/day: 1.00    Years: 20.00    Pack years: 20.00    Types: Cigarettes    Last attempt to quit: 08/12/2016    Years since quitting: 0.6  . Smokeless tobacco: Never Used  Substance and Sexual Activity  . Alcohol use: No    Alcohol/week: 0.0 oz    Comment: occ  . Drug use: No  . Sexual activity: Not on file  Other Topics Concern  . Not on file  Social History Narrative  . Not on file    Allergies: No Known Allergies  Metabolic Disorder Labs: Lab Results  Component Value Date   HGBA1C 7.1 (H) 08/26/2016   MPG 157 08/26/2016   No results found  for: PROLACTIN No results found for: CHOL, TRIG, HDL, CHOLHDL, VLDL, LDLCALC Lab Results  Component Value Date   TSH 0.978 02/15/2017    Therapeutic Level Labs: No results found for: LITHIUM No results found for: VALPROATE No components found for:  CBMZ  Current Medications: Current Outpatient Medications  Medication Sig Dispense Refill  . ARIPiprazole (ABILIFY) 15 MG tablet Take 1 tablet (15 mg total) by mouth daily. 30 tablet 0  . glipiZIDE (GLUCOTROL) 5 MG tablet Take 5 mg by mouth daily at 12 noon.     . hydrOXYzine (VISTARIL) 25 MG capsule Take 1 capsule (25 mg total) by mouth at bedtime. (Patient not taking: Reported on 02/19/2017) 30 capsule 2  . LANTUS SOLOSTAR 100 UNIT/ML injection Inject 28 Units into the skin at bedtime.     . nicotine (NICODERM CQ - DOSED IN MG/24 HOURS) 21 mg/24hr patch Place 21 mg onto the skin daily.    . ONE TOUCH ULTRA TEST test strip 1 each by Other route every evening.     . simvastatin (ZOCOR) 20 MG tablet Take 20 mg by mouth daily at 12 noon.     . traZODone (DESYREL) 50 MG tablet Take 1 - 2 tablets at bedtime as needed for sleep (Patient not taking: Reported on 02/19/2017) 180 tablet 0   Current Facility-Administered Medications  Medication Dose Route Frequency Provider Last Rate Last Dose  . ARIPiprazole ER PRSY 400 mg  400 mg Intramuscular Q30 days Kathlee Nations, MD   400 mg at 02/22/17 0941     Musculoskeletal: Strength & Muscle Tone: within normal limits Gait & Station: normal Patient leans: N/A  Psychiatric Specialty Exam: ROS  Blood pressure 130/86, pulse 100, height 5' 10.5" (1.791 m), weight 218 lb (98.9 kg).There is no height or weight on file to calculate BMI.  General Appearance: Casual  Eye Contact:  Fair  Speech:  Clear and Coherent  Volume:  Normal  Mood:  Anxious  Affect:  Constricted  Thought Process:  Goal Directed  Orientation:  Full (Time, Place, and Person)  Thought Content: Paranoid Ideation   Suicidal Thoughts:   No  Homicidal Thoughts:  No  Memory:  Immediate;   Fair Recent;   Fair Remote;   Good  Judgement:  Good  Insight:  Good  Psychomotor Activity:  Normal  Concentration:  Concentration: Fair and Attention Span: Fair  Recall:  [employer] of Knowledge: Good  Language: Good  Akathisia:  No  Handed:  Right  AIMS (if indicated): not done  Assets:  Communication Skills Desire for Improvement Housing  ADL's:  Intact  Cognition: WNL  Sleep:  Fair   Screenings: PHQ2-9     Follow Up 20 from 02/19/2017 in [facility] Follow Up 20 from 11/16/2016 in [facility] from 08/28/2016 in [facility]  PHQ-2 Total Score  0  0  0       Assessment and Plan: Schizophrenia chronic paranoid type.  I will discontinue Abilify injection as patient preferred to take Abilify by mouth.  He is no longer taking oral hypoglycemic because his hemoglobin A1c is improved and within normal limit.  Is also no longer taking trazodone and Vistaril.  He does not want to take any medication for insomnia at this time.  He is hoping increased dose of Abilify will help his insomnia.  We will try Abilify 20 mg daily.  Discussed medication side effects and benefits.  He is not interested in counseling.  Discussed in length about cigarette smoking, patient has a history of throat cancer and is strongly encouraged to stop the smoking to avoid any relapse.  Patient promised that he will work on it.  Recommended to call us back if he has any question or any concern.  Follow-up in 2 months.   Thereasa Iannello T., MD 03/22/2017, 12:39 PM

## 2017-03-26 ENCOUNTER — Telehealth (HOSPITAL_COMMUNITY): Payer: Self-pay

## 2017-03-26 NOTE — Telephone Encounter (Signed)
Patients mother called me this morning, patient has deteriorated. He called his mother this morning and told her that she was following him around his apartment. Patient said he was going to have his landlord ban her from the premises and he wanted his house key. He also told her that he was going to social security to change his payee. I went to Dr. Adele Schilder and he called patients mother while I was in the room - they discussed putting the patient back on Risperidone 2 mg tablets, as he was more stable on this before. Patients mother usually fills his prescription boxes every week and when she checks it, it appears that patient has taken his medication - but patient is still deteriorating. Patients mother agreed to restart the Risperidone and stated she had enough to get to patients January appointment. She was concerned that patient was going to social security in an agitated state and Dr. Adele Schilder recommended she either call ahead or go there herself to let them know that he was coming and would be upset. She agreed to reach out to patient to try to get him his risperidone and was concerned that he may be too far gone to listen to her. Dr. Adele Schilder advised that she should take him to the ED for an evaluation and that if he would not go willingly, he supported patients mother getting him IVC'd by the magistrate. Patients mother agreed to this plan and stated that she would call us if he went to the ED so that Dr. Adele Schilder could speak to the attending. Dr. Adele Schilder also asked patients mother to please move up his appointment, he wants him seen before January. She stated she would do this as well and they will discuss starting the Risperidone Consta injections at that time.

## 2017-03-26 NOTE — Telephone Encounter (Signed)
Patients mother is calling, she said that he is in a state this mor

## 2017-04-02 ENCOUNTER — Other Ambulatory Visit (HOSPITAL_COMMUNITY): Payer: Self-pay

## 2017-04-02 MED ORDER — RISPERIDONE 2 MG PO TABS: 2.0000 mg | ORAL_TABLET | Freq: Every day | ORAL | 0 refills | Status: DC

## 2017-04-05 ENCOUNTER — Ambulatory Visit (HOSPITAL_COMMUNITY): Payer: Self-pay | Admitting: Psychiatry

## 2017-04-13 ENCOUNTER — Telehealth (HOSPITAL_COMMUNITY): Payer: Self-pay

## 2017-04-13 NOTE — Telephone Encounter (Signed)
I returned patient's mother phone call.  She is concerned that patient is smoking and medicine not working as good.  He remains very paranoid and she is afraid that he may be evicted from the apartment.  Patient does not want injectables.  I recommended to try Risperdal 3 mg to see if it helps his paranoia and anger.  Discussed cigarette smoking cessation programs and resources.  Recommended to call us back if symptoms do not improve.  Discussed safety concerns at any time having active suicidal thoughts or homicidal thought that he need to call 911 or go to local emergency room.

## 2017-04-13 NOTE — Telephone Encounter (Signed)
Patients mother is calling, she said that [place] is still very aggressive and paranoid. She said the medication is not working and she is concerned that he is going to be evicted. She would like a call back from you, she is considering possibly putting patient in group home.

## 2017-04-19 DIAGNOSIS — R1310 Dysphagia, unspecified: Secondary | ICD-10-CM

## 2017-04-19 NOTE — Therapy (Signed)
[facility] [address] [place], [place], [address] Phone: [phone]   Fax:  [phone]  Patient Details  Name: Kevin Deleon MRN: [id number] Date of Birth: 1971-05-08 Referring Provider:  Eppie Gibson, MD  Encounter Date: 04/19/2017  SPEECH THERAPY DISCHARGE SUMMARY  Visits from Start of Care: Two  Current functional level related to goals / functional outcomes: Pt did not meet any STGs or LTGs. He did not schedule further ST appointments when called.   Remaining deficits: Assumed deficits remain, pt has not been seen since 10-05-16, but SLP cannot be sure.   Education / Equipment: HEP, late effects head/neck radiation on swallow function.  Plan: Patient agrees to discharge.  Patient goals were not met. Patient is being discharged due to not returning since the last visit.  ?????      [place] ,MS, CCC-SLP  04/19/2017, 2:30 PM  [facility] [address] [place], [place], [address] Phone: [phone]   Fax:  [phone]

## 2017-05-21 NOTE — Progress Notes (Signed)
Kevin Deleon presents for follow up of radiation completed 11/03/16 to his supraglottis.   Pain issues, if any: He denies Using a feeding tube?: N/A Weight changes, if any: He is eating less "junk food" per his mother.  Wt Readings from Last 3 Encounters:  05/28/17 209 lb 12.8 oz (95.2 kg)  02/19/17 221 lb 12.8 oz (100.6 kg)  12/07/16 225 lb (102.1 kg)   Swallowing issues, if any: He denies difficulty swallowing Smoking or chewing tobacco? He is smoking several cigarettes daily.  Using fluoride trays daily? N/A Last ENT visit was on: Dr. Lucia Gaskins. He has an appointment to see him in March. His mother is not sure when he last saw Dr. Lucia Gaskins. His mother believes he had a laryngoscopy done at that time.  Other notable issues, if any:  He denies any other concerns at this time.  He tells me that he will not consent to Laryngoscopy today.   BP 119/80   Pulse 77   Temp 98.3 F (36.8 C)   Ht 5' 10.5" (1.791 m)   Wt 209 lb 12.8 oz (95.2 kg)   SpO2 98% Comment: room air  BMI 29.68 kg/m

## 2017-05-24 ENCOUNTER — Encounter (HOSPITAL_COMMUNITY): Payer: Self-pay | Admitting: Psychiatry

## 2017-05-24 ENCOUNTER — Ambulatory Visit (INDEPENDENT_AMBULATORY_CARE_PROVIDER_SITE_OTHER): Payer: Medicare Other | Admitting: Psychiatry

## 2017-05-24 VITALS — BP 128/88 | HR 97 | Ht 70.5 in | Wt 207.0 lb

## 2017-05-24 DIAGNOSIS — F250 Schizoaffective disorder, bipolar type: Secondary | ICD-10-CM

## 2017-05-24 DIAGNOSIS — F17210 Nicotine dependence, cigarettes, uncomplicated: Secondary | ICD-10-CM

## 2017-05-24 MED ORDER — RISPERIDONE 3 MG PO TABS: 3.0000 mg | ORAL_TABLET | Freq: Every day | ORAL | 0 refills | Status: DC

## 2017-05-24 NOTE — Progress Notes (Signed)
[facility] MD/PA/NP OP Progress Note  05/24/2017 11:30 AM Kevin Deleon  MRN:  [id number]  Chief Complaint: I like Risperdal.  It is helping my paranoia.  HPI: Kevin Deleon came for his follow-up appointment with his mother.  He is now taking Risperdal 3 mg which is helping her paranoia, agitation and sleep.  He is no longer taking Abilify.  His mother endorsed much improvement in his mood irritability and anger.  He continues to get dialysis on Tuesday, Thursday and Saturday.  Currently he is not on transplant list because he need to be cancer free for more than a year.  He is scheduled to see his oncologist next week for his throat cancer.  Patient denies any illegal substance use.  He is not interested in Risperdal Consta.  He has no tremors shakes or any EPS.  His energy level is okay.  Visit Diagnosis:    ICD-10-CM   1. Schizoaffective disorder, bipolar type ([place]) F25.0 risperiDONE (RISPERDAL) 3 MG tablet    Past Psychiatric History: Reviewed. Patient has psychiatric illness since age 47. He has paranoia, mania, psychosis and disorganized thinking. He has at least 5 psychiatric hospitalization mostly due to noncompliance with medication. He was seeing [facility] mental health for many years. In the past he had tried Haldol which he actually likes but when the dose was increased to 15 mg he became zombie and stopped. He has taken lithium in the past this was stopped because of renal insufficiency. In the past he had tried amitriptyline given by Dr. Tamala Julian and Risperdal but switched to Abilify because of better compliance.   Past Medical History:  Past Medical History:  Diagnosis Date  . Chronic kidney disease    dialysis Tue-Thur-Sat  . Diabetes mellitus ([place])   . Dialysis patient ([place])    T, TH, SAT at [facility]  . GERD (gastroesophageal reflux disease)   . 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 HTN (hypertension)    off BP meds since dialysis started  03-2016  . Hyperlipidemia   . Liver disease    patient denied 08/26/16  . Mental disorder    Schizo- affective disorder  . Schizophrenia ([place])    schizo-affective  . Smoker    quit one week ago  . Smokers' cough [facility])     Past Surgical History:  Procedure Laterality Date  . AV FISTULA PLACEMENT Left 05/24/2013   Procedure: ARTERIOVENOUS (AV) FISTULA CREATION BRACHIAL-CEPHALIC;  Surgeon: Conrad San Simon, MD;  Location: [place];  Service: Vascular;  Laterality: Left;  . DIRECT LARYNGOSCOPY N/A 08/17/2016   Procedure: DIRECT LARYNGOSCOPY WITH BIOPSY AND FROZEN SECTION;  Surgeon: Rozetta Nunnery, MD;  Location: [facility];  Service: ENT;  Laterality: N/A;  . MULTIPLE EXTRACTIONS WITH ALVEOLOPLASTY N/A 08/31/2016   Procedure: Extraction of tooth #'s 2-10, 13-15, and 19-30 with alveoloplasty;  Surgeon: Lenn Cal, DDS;  Location: [facility];  Service: Oral Surgery;  Laterality: N/A;    Family Psychiatric History: Reviewed.  Family History:  Family History  Problem Relation Age of Onset  . Cancer - Lung Other   . Cancer Other     Social History:  Social History   Socioeconomic History  . Marital status: Single    Spouse name: None  . Number of children: None  . Years of education: None  . Highest education level: None  Social Needs  . Financial resource strain: None  . Food insecurity - worry: None  .  Food insecurity - inability: None  . Transportation needs - medical: None  . Transportation needs - non-medical: None  Occupational History  . None  Tobacco Use  . Smoking status: Current Some Day Smoker    Packs/day: 1.00    Years: 20.00    Pack years: 20.00    Types: Cigarettes    Last attempt to quit: 08/12/2016    Years since quitting: 0.7  . Smokeless tobacco: Never Used  . Tobacco comment: 5 cigarettes a week and trying to quit  Substance and Sexual Activity  . Alcohol use: No    Alcohol/week: 0.0 oz    Frequency: Never    Comment: occ  . Drug use:  No  . Sexual activity: Not Currently  Other Topics Concern  . None  Social History Narrative  . None    Allergies: No Known Allergies  Metabolic Disorder Labs: Lab Results  Component Value Date   HGBA1C 7.1 (H) 08/26/2016   MPG 157 08/26/2016   No results found for: PROLACTIN No results found for: CHOL, TRIG, HDL, CHOLHDL, VLDL, LDLCALC Lab Results  Component Value Date   TSH 0.978 02/15/2017    Therapeutic Level Labs: No results found for: LITHIUM No results found for: VALPROATE No components found for:  CBMZ  Current Medications: Current Outpatient Medications  Medication Sig Dispense Refill  . doxycycline (VIBRA-TABS) 100 MG tablet Take 100 mg by mouth every 12 (twelve) hours. For 10 days  0  . nicotine (NICODERM CQ - DOSED IN MG/24 HOURS) 21 mg/24hr patch Place 21 mg onto the skin daily.    . ONE TOUCH ULTRA TEST test strip 1 each by Other route every evening.     . risperiDONE (RISPERDAL) 3 MG tablet Take 1 tablet (3 mg total) by mouth daily. 90 tablet 0  . simvastatin (ZOCOR) 20 MG tablet Take 20 mg by mouth daily at 12 noon.     Mar[place] Kitchen LANTUS SOLOSTAR 100 UNIT/ML injection Inject 28 Units into the skin at bedtime.      No current facility-administered medications for this visit.      Musculoskeletal: Strength & Muscle Tone: within normal limits Gait & Station: normal Patient leans: N/A  Psychiatric Specialty Exam: ROS  Blood pressure 128/88, pulse 97, height 5' 10.5" (1.791 m), weight 207 lb (93.9 kg), SpO2 97 %.Body mass index is 29.28 kg/m.  General Appearance: Casual  Eye Contact:  Fair  Speech:  Slow  Volume:  Decreased  Mood:  Euthymic  Affect:  Congruent  Thought Process:  Goal Directed  Orientation:  Full (Time, Place, and Person)  Thought Content: Logical   Suicidal Thoughts:  No  Homicidal Thoughts:  No  Memory:  Immediate;   Good Recent;   Good Remote;   Good  Judgement:  Good  Insight:  Good  Psychomotor Activity:  Normal   Concentration:  Concentration: Fair and Attention Span: Fair  Recall:  Good  Fund of Knowledge: Good  Language: Fair  Akathisia:  No  Handed:  Right  AIMS (if indicated): not done  Assets:  Communication Skills Desire for Improvement Housing  ADL's:  Intact  Cognition: WNL  Sleep:  Good   Screenings: PHQ2-9     Follow Up 20 from 02/19/2017 in [facility] Follow Up 20 from 11/16/2016 in [facility] from 08/28/2016 in [facility]  PHQ-2 Total Score  0  0  0  Assessment and Plan: Schizophrenia chronic paranoid type.  Patient doing better on Risperdal.  He is taking 3 mg which is helping his sleep, irritability and mania.  He is not interested in Risperdal injection.  He is no longer taking Vistaril and trazodone.  Discussed medication side effects and benefits.  Recommended to call us back if is any question or any concern.  Encouraged to keep appointment with his oncologist for his throat cancer.  Follow-up in 3 months.   Kathlee Nations, MD 05/24/2017, 11:30 AM

## 2017-05-28 ENCOUNTER — Encounter: Payer: Self-pay | Admitting: Radiation Oncology

## 2017-05-28 ENCOUNTER — Ambulatory Visit
Admission: RE | Admit: 2017-05-28 | Discharge: 2017-05-28 | Disposition: A | Discharge status: Home / Self Care | Payer: Medicare Other | Source: Ambulatory Visit | Attending: Radiation Oncology | Admitting: Radiation Oncology

## 2017-05-28 VITALS — BP 119/80 | HR 77 | Temp 98.3°F | Ht 70.5 in | Wt 209.8 lb

## 2017-05-28 DIAGNOSIS — Z79899 Other long term (current) drug therapy: Secondary | ICD-10-CM | POA: Diagnosis not present

## 2017-05-28 DIAGNOSIS — Z794 Long term (current) use of insulin: Secondary | ICD-10-CM | POA: Insufficient documentation

## 2017-05-28 DIAGNOSIS — F17210 Nicotine dependence, cigarettes, uncomplicated: Secondary | ICD-10-CM | POA: Diagnosis not present

## 2017-05-28 DIAGNOSIS — C321 Malignant neoplasm of supraglottis: Secondary | ICD-10-CM

## 2017-05-28 DIAGNOSIS — Z08 Encounter for follow-up examination after completed treatment for malignant neoplasm: Secondary | ICD-10-CM | POA: Diagnosis present

## 2017-05-28 DIAGNOSIS — Z8521 Personal history of malignant neoplasm of larynx: Secondary | ICD-10-CM | POA: Diagnosis not present

## 2017-05-28 DIAGNOSIS — Z923 Personal history of irradiation: Secondary | ICD-10-CM | POA: Diagnosis not present

## 2017-05-28 MED ORDER — FLUCONAZOLE 100 MG PO TABS: ORAL_TABLET | ORAL | 0 refills | Status: DC

## 2017-05-28 NOTE — Progress Notes (Signed)
Radiation Oncology         [phone] ________________________________  Name: Kevin Deleon MRN: [id number]  Date: 05/28/2017  DOB: December 27, 1970  Follow-Up Visit Note  CC: London Pepper, MD  Rozetta Nunnery, *  Diagnosis and Prior Radiotherapy:       ICD-10-CM   1. 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 He is doing well oveall. Mr. Zulauf completed radiation on 11/03/2016. The patient notes that he has lost some weight which his mother attributes to eating less "junk food" and exercising regularly. Pt states that he is down to smoking 2-3 cigarettes per day. Pt next ENT visit with Dr. Lucia Gaskins is scheduled for March and his mother is not sure when he last saw Dr. Lucia Gaskins and she believes he had a laryngoscopy done at the time ( I found the records stating NED per scope results). The pt states that he will not consent to a Laryngoscopy today despite medical advice. Pt presents with his mother. He denies having any pain or difficulty swallowing.        ALLERGIES:  has No Known Allergies.  Meds: Current Outpatient Medications  Medication Sig Dispense Refill  . doxycycline (VIBRA-TABS) 100 MG tablet Take 100 mg by mouth every 12 (twelve) hours. For 10 days  0  . nicotine (NICODERM CQ - DOSED IN MG/24 HOURS) 21 mg/24hr patch Place 21 mg onto the skin daily.    . ONE TOUCH ULTRA TEST test strip 1 each by Other route every evening.     . risperiDONE (RISPERDAL) 3 MG tablet Take 1 tablet (3 mg total) by mouth daily. 90 tablet 0  . simvastatin (ZOCOR) 20 MG tablet Take 20 mg by mouth daily at 12 noon.     . fluconazole (DIFLUCAN) 100 MG tablet Hold Simvastatin while on this medication. Take 2 tablets tomorrow, then one tablet  daily for 6 more days. 8 tablet 0  . LANTUS SOLOSTAR 100 UNIT/ML injection Inject 28 Units into the skin at bedtime.      No current facility-administered medications for this encounter.     Physical Findings: The patient is in no acute distress. Patient is alert and oriented. Wt Readings from Last 3 Encounters:  05/28/17 209 lb 12.8 oz (95.2 kg)  02/19/17 221 lb 12.8 oz (100.6 kg)  12/07/16 225 lb (102.1 kg)    height is 5' 10.5" (1.791 m) and weight is 209 lb 12.8 oz (95.2 kg). His temperature is 98.3 F (36.8 C). His blood pressure is 119/80 and his pulse is 77. His oxygen saturation is 98%. .   General: Alert and oriented, in no acute distress HEENT: Head is normocephalic. Extraocular movements are intact. Oropharynx / mouth is notable for mild thrush.    Neck: Neck is supple and no palpable cervical. Moderate lymphedema in neck. Skin has healed well over his neck.  Heart: Regular in rate and rhythm with no murmurs, rubs, or gallops. Chest: Clear to auscultation bilaterally, with no rhonchi, wheezes, or rales. Skin: Skin in treatment fields shows satisfactory healing over neck.   Lab Findings: Lab Results  Component Value Date   WBC 10.9 (H) 08/26/2016   HGB 13.3 08/31/2016   HCT 39.0 08/31/2016   MCV 97.8 08/26/2016  PLT 244 08/26/2016    Lab Results  Component Value Date   TSH 0.978 02/15/2017    Radiographic Findings: No results found.  Impression/Plan:    1) Head and Neck Cancer Status: NED though pt denied a laryngoscopy to internally monitor his status.  2) Nutritional Status: stable  PEG tube: none  3) Risk Factors: The patient has been educated about risk factors including alcohol and tobacco abuse; they understand that avoidance of alcohol and tobacco is important to prevent recurrences as well as other cancers. Patient is currently smoking 2-3 cigarettes daily and is motivated to eventually quit but is not ready to set a quit date.            4)  Swallowing: functional; patient does not have teeth which limits his food intake.  5) Thrush: Fluconazole rx'd today.  6) Thyroid function: wnl - recheck annually Lab Results  Component Value Date   TSH 0.978 02/15/2017     7) Other: Recommended heart healthy diet with increase in exercise. No other issues  8) Follow-up in  6 months, where his TSH will be checked.   Pt will see Dr. Lucia Gaskins in March.  Strongly recommend laryngoscopy then.    __________________________________   Eppie Gibson, MD  This document serves as a record of services personally performed by Eppie Gibson, MD, MD. It was created on her behalf by Valeta Harms, a trained medical scribe. The creation of this record is based on the scribe's personal observations and the provider's statements to them. This document has been checked and approved by the attending provider.

## 2017-05-31 ENCOUNTER — Other Ambulatory Visit: Payer: Self-pay | Admitting: Radiation Oncology

## 2017-05-31 ENCOUNTER — Telehealth: Payer: Self-pay | Admitting: *Deleted

## 2017-05-31 ENCOUNTER — Encounter: Payer: Self-pay | Admitting: Radiation Oncology

## 2017-05-31 DIAGNOSIS — Z1329 Encounter for screening for other suspected endocrine disorder: Secondary | ICD-10-CM

## 2017-05-31 NOTE — Telephone Encounter (Signed)
CALLED PATIENT TO INFORM OF LAB ON 11-26-17 @ 9:30 AM , AND HIS FU WITH DR. Isidore Moos ON 11-26-17 @ 10 AM, SPOKE WITH PATIENT AND HE IS AWARE OF THESE APPTS.

## 2017-06-21 ENCOUNTER — Ambulatory Visit
Admission: RE | Admit: 2017-06-21 | Discharge: 2017-06-21 | Disposition: A | Discharge status: Home / Self Care | Payer: Medicare Other | Source: Ambulatory Visit | Attending: Family Medicine | Admitting: Family Medicine

## 2017-06-21 ENCOUNTER — Other Ambulatory Visit: Payer: Self-pay | Admitting: Family Medicine

## 2017-06-21 DIAGNOSIS — R0689 Other abnormalities of breathing: Secondary | ICD-10-CM

## 2017-07-19 ENCOUNTER — Other Ambulatory Visit (HOSPITAL_COMMUNITY): Payer: Self-pay

## 2017-07-19 DIAGNOSIS — F250 Schizoaffective disorder, bipolar type: Secondary | ICD-10-CM

## 2017-07-19 MED ORDER — RISPERIDONE 3 MG PO TABS: 3.0000 mg | ORAL_TABLET | Freq: Every day | ORAL | 0 refills | Status: DC

## 2017-08-10 ENCOUNTER — Telehealth: Payer: Self-pay | Admitting: *Deleted

## 2017-08-10 NOTE — Telephone Encounter (Signed)
CALLED PATIENT TO ASK ABOUT ALTERING FU ON 11-26-17 DUE TO DR. SQUIRE BEING ON VACATION, PATIENT AGREED TO 12/03/17

## 2017-08-23 ENCOUNTER — Ambulatory Visit (INDEPENDENT_AMBULATORY_CARE_PROVIDER_SITE_OTHER): Payer: Medicare Other | Admitting: Psychiatry

## 2017-08-23 ENCOUNTER — Encounter (HOSPITAL_COMMUNITY): Payer: Self-pay | Admitting: Psychiatry

## 2017-08-23 DIAGNOSIS — F17210 Nicotine dependence, cigarettes, uncomplicated: Secondary | ICD-10-CM

## 2017-08-23 DIAGNOSIS — F250 Schizoaffective disorder, bipolar type: Secondary | ICD-10-CM

## 2017-08-23 MED ORDER — RISPERIDONE 3 MG PO TABS: 3.0000 mg | ORAL_TABLET | Freq: Every day | ORAL | 0 refills | Status: DC

## 2017-08-23 NOTE — Progress Notes (Signed)
[facility] MD/PA/NP OP Progress Note  08/23/2017 10:32 AM Kevin Deleon  MRN:  [id number]  Chief Complaint: I am feeling better with Risperdal.  I am sleeping good.  HPI: Kevin Deleon came for his follow-up appointment with his mother.  He is taking Risperdal 3 mg every night which is helping his agitation, anger, paranoia and sleep.  Recently he seen his ENT specialist for his throat cancer.  His mother endorsed that his throat cancer is healing and he has no more difficulty swallowing.  Patient has cancer of supra epiglottis.  He continues to get dialysis on Tuesday Thursday and Saturday.  He is hoping to cancer free for 1 year so he can back on transplant list.  Mother endorsed that since he is taking the Risperdal he has no aggression and he is more calm.  Patient is not interested and Risperdal Consta since he has no difficulty swallowing the tablets.  Patient has no tremors, shakes or any EPS.  He denies drinking alcohol or using any illegal substances.  His appetite is okay.  His energy level is fair.  Visit Diagnosis:    ICD-10-CM   1. Schizoaffective disorder, bipolar type ([place]) F25.0 risperiDONE (RISPERDAL) 3 MG tablet    Past Psychiatric History: Reviewed Patient has psychiatric illness since age 56. He has paranoia, mania, psychosis and disorganized thinking. He has at least 5 psychiatric hospitalization mostly due to noncompliance with medication. He was seeing [facility] mental health for many years. In the past he had tried Haldol which he actually likes but when the dose was increased to 15 mg he became zombie and stopped. He has taken lithium but stopped because of renal insufficiency. He also tried amitriptyline and Abilify but he had a best response with Risperdal.     Past Medical History:  Past Medical History:  Diagnosis Date  . Chronic kidney disease    dialysis Tue-Thur-Sat  . Diabetes mellitus ([place])   . Dialysis patient ([place])    T, TH, SAT at [facility]  . GERD  (gastroesophageal reflux disease)   . 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 HTN (hypertension)    off BP meds since dialysis started 03-2016  . Hyperlipidemia   . Liver disease    patient denied 08/26/16  . Mental disorder    Schizo- affective disorder  . Schizophrenia ([place])    schizo-affective  . Smoker    quit one week ago  . Smokers' cough [facility])     Past Surgical History:  Procedure Laterality Date  . AV FISTULA PLACEMENT Left 05/24/2013   Procedure: ARTERIOVENOUS (AV) FISTULA CREATION BRACHIAL-CEPHALIC;  Surgeon: Conrad DeWitt, MD;  Location: [place];  Service: Vascular;  Laterality: Left;  . DIRECT LARYNGOSCOPY N/A 08/17/2016   Procedure: DIRECT LARYNGOSCOPY WITH BIOPSY AND FROZEN SECTION;  Surgeon: Rozetta Nunnery, MD;  Location: [place];  Service: ENT;  Laterality: N/A;  . MULTIPLE EXTRACTIONS WITH ALVEOLOPLASTY N/A 08/31/2016   Procedure: Extraction of tooth #'s 2-10, 13-15, and 19-30 with alveoloplasty;  Surgeon: Lenn Cal, DDS;  Location: [place];  Service: Oral Surgery;  Laterality: N/A;    Family Psychiatric History: Reviewed.  Family History:  Family History  Problem Relation Age of Onset  . Cancer - Lung Other   . Cancer Other     Social History:  Social History   Socioeconomic History  . Marital status: Single    Spouse name: Not on file  . Number of  children: Not on file  . Years of education: Not on file  . Highest education level: Not on file  Occupational History  . Not on file  Social Needs  . Financial resource strain: Not on file  . Food insecurity:    Worry: Not on file    Inability: Not on file  . Transportation needs:    Medical: Not on file    Non-medical: Not on file  Tobacco Use  . Smoking status: Current Some Day Smoker    Packs/day: 1.00    Years: 20.00    Pack years: 20.00    Types: Cigarettes    Last attempt to quit: 08/12/2016    Years since quitting: 1.0  . Smokeless tobacco:  Never Used  . Tobacco comment: 5 cigarettes a week and trying to quit  Substance and Sexual Activity  . Alcohol use: No    Alcohol/week: 0.0 oz    Frequency: Never    Comment: occ  . Drug use: No  . Sexual activity: Not Currently  Lifestyle  . Physical activity:    Days per week: Not on file    Minutes per session: Not on file  . Stress: Not on file  Relationships  . Social connections:    Talks on phone: Not on file    Gets together: Not on file    Attends religious service: Not on file    Active member of club or organization: Not on file    Attends meetings of clubs or organizations: Not on file    Relationship status: Not on file  Other Topics Concern  . Not on file  Social History Narrative  . Not on file    Allergies: No Known Allergies  Metabolic Disorder Labs: Lab Results  Component Value Date   HGBA1C 7.1 (H) 08/26/2016   MPG 157 08/26/2016   No results found for: PROLACTIN No results found for: CHOL, TRIG, HDL, CHOLHDL, VLDL, LDLCALC Lab Results  Component Value Date   TSH 0.978 02/15/2017    Therapeutic Level Labs: No results found for: LITHIUM No results found for: VALPROATE No components found for:  CBMZ  Current Medications: Current Outpatient Medications  Medication Sig Dispense Refill  . LANTUS SOLOSTAR 100 UNIT/ML injection Inject 28 Units into the skin at bedtime.     . nicotine (NICODERM CQ - DOSED IN MG/24 HOURS) 21 mg/24hr patch Place 21 mg onto the skin daily.    . ONE TOUCH ULTRA TEST test strip 1 each by Other route every evening.     . risperiDONE (RISPERDAL) 3 MG tablet Take 1 tablet (3 mg total) by mouth daily. 90 tablet 0  . simvastatin (ZOCOR) 20 MG tablet Take 20 mg by mouth daily at 12 noon.      No current facility-administered medications for this visit.      Musculoskeletal: Strength & Muscle Tone: within normal limits Gait & Station: normal Patient leans: N/A  Psychiatric Specialty Exam: ROS  Blood pressure 138/90,  pulse 72, height 5\' 10"  (1.778 m), weight 206 lb (93.4 kg).There is no height or weight on file to calculate BMI.  General Appearance: Casual  Eye Contact:  Fair  Speech:  Slow  Volume:  Decreased  Mood:  Euthymic  Affect:  Congruent  Thought Process:  Goal Directed  Orientation:  Full (Time, Place, and Person)  Thought Content: Logical   Suicidal Thoughts:  No  Homicidal Thoughts:  No  Memory:  Immediate;   Fair Recent;  Fair Remote;   Fair  Judgement:  Good  Insight:  Good  Psychomotor Activity:  Normal  Concentration:  Concentration: Fair and Attention Span: Fair  Recall:  [place] of Knowledge: Good  Language: Good  Akathisia:  No  Handed:  Right  AIMS (if indicated): not done  Assets:  Communication Skills Desire for Improvement Housing Resilience Social Support  ADL's:  Intact  Cognition: WNL  Sleep:  Good   Screenings: PHQ2-9     Follow Up 20 from 05/28/2017 in [facility] Follow Up 20 from 02/19/2017 in [facility] Follow Up 20 from 11/16/2016 in [place] from 08/28/2016 in [place] [facility]  PHQ-2 Total Score  0  0  0  0       Assessment and Plan: Schizophrenia chronic paranoid type.  I reviewed records from other providers.  Patient is scheduled to see his physician on May 13.  He recently seen ENT and oncologist.  His throat cancer is slowly and gradually getting better.  She does not want to change the medication since he is able to swallow the tablet.  Continue Risperdal 3 mg at bedtime.  He is not interested in counseling.  Discussed medication side effects and benefits.  Patient has no rash, itching, tremors shakes or any EPS.  Recommended to call us back if he has any question or any concern.  Follow-up in 3 months.   Kathlee Nations, MD 08/23/2017, 10:32 AM

## 2017-11-22 ENCOUNTER — Ambulatory Visit (HOSPITAL_COMMUNITY): Payer: Medicare Other | Admitting: Psychiatry

## 2017-11-22 ENCOUNTER — Other Ambulatory Visit (HOSPITAL_COMMUNITY): Payer: Self-pay

## 2017-11-22 DIAGNOSIS — F250 Schizoaffective disorder, bipolar type: Secondary | ICD-10-CM

## 2017-11-22 MED ORDER — RISPERIDONE 3 MG PO TABS: 3.0000 mg | ORAL_TABLET | Freq: Every day | ORAL | 0 refills | Status: DC

## 2017-11-26 ENCOUNTER — Ambulatory Visit: Payer: Medicare Other

## 2017-11-26 ENCOUNTER — Ambulatory Visit: Payer: Self-pay | Admitting: Radiation Oncology

## 2017-12-02 NOTE — Progress Notes (Signed)
[name] presents for follow up of radiation completed 11/03/16 to his Supraglottis.   Pain issues, if any: He denies Using a feeding tube?: N/A Weight changes, if any:  Wt Readings from Last 3 Encounters:  12/08/17 205 lb 9.6 oz (93.3 kg)  05/28/17 209 lb 12.8 oz (95.2 kg)  02/19/17 221 lb 12.8 oz (100.6 kg)   Swallowing issues, if any: He denies Smoking or chewing tobacco? He is smoking about 10 cigarettes daily.   Using fluoride trays daily?  Last ENT visit was on: Dr. Lucia Gaskins 07/2017, Laryngoscopy performed.  Other notable issues, if any:  His breathing is "noisy" sounding. Dr. 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 He will see Dr. Lucia Gaskins again in September for evaluation. His mother feels like this has actually improved over the past few months.   BP 115/72   Pulse 98   Temp 97.9 F (36.6 C)   Resp (!) 22   Ht 5\' 10"  (1.778 m)   Wt 205 lb 9.6 oz (93.3 kg)   SpO2 99% Comment: room air  BMI 29.50 kg/m

## 2017-12-03 ENCOUNTER — Ambulatory Visit: Payer: Medicare Other

## 2017-12-03 ENCOUNTER — Ambulatory Visit: Payer: Medicare Other | Admitting: Radiation Oncology

## 2017-12-07 ENCOUNTER — Telehealth: Payer: Self-pay | Admitting: *Deleted

## 2017-12-07 NOTE — Telephone Encounter (Signed)
CALLED PATIENT TO REMIND OF LAB PRIOR TO DR. SQUIRE FU ON 12-08-17, SPOKE WITH MOTHER - BONNIE AND SHE IS AWARE OF THIS APPT.

## 2017-12-08 ENCOUNTER — Other Ambulatory Visit: Payer: Self-pay

## 2017-12-08 ENCOUNTER — Encounter: Payer: Self-pay | Admitting: Radiation Oncology

## 2017-12-08 ENCOUNTER — Ambulatory Visit
Admission: RE | Admit: 2017-12-08 | Discharge: 2017-12-08 | Disposition: A | Discharge status: Home / Self Care | Payer: Medicare Other | Source: Ambulatory Visit | Attending: Radiation Oncology | Admitting: Radiation Oncology

## 2017-12-08 VITALS — BP 115/72 | HR 98 | Temp 97.9°F | Resp 22 | Ht 70.0 in | Wt 205.6 lb

## 2017-12-08 DIAGNOSIS — Z85850 Personal history of malignant neoplasm of thyroid: Secondary | ICD-10-CM | POA: Insufficient documentation

## 2017-12-08 DIAGNOSIS — C321 Malignant neoplasm of supraglottis: Secondary | ICD-10-CM | POA: Insufficient documentation

## 2017-12-08 DIAGNOSIS — Z08 Encounter for follow-up examination after completed treatment for malignant neoplasm: Secondary | ICD-10-CM | POA: Diagnosis not present

## 2017-12-08 DIAGNOSIS — Z1329 Encounter for screening for other suspected endocrine disorder: Secondary | ICD-10-CM

## 2017-12-08 DIAGNOSIS — Z923 Personal history of irradiation: Secondary | ICD-10-CM | POA: Insufficient documentation

## 2017-12-08 NOTE — Progress Notes (Signed)
Radiation Oncology         [phone] ________________________________  Name: Kevin Deleon MRN: [id number]  Date: 12/08/2017  DOB: 12-06-1970  Follow-Up Visit Note  CC: London Pepper, MD  Rozetta Nunnery, *  Diagnosis and Prior Radiotherapy:       ICD-10-CM   1. 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 He is accompanied by his parents and is doing well overall. He last saw Dr. Lucia Gaskins in March, who performed a laryngoscopy then. He denied a laryngoscopy today, stating that he would prefer Dr. Lucia Gaskins perform them.  A follow-up chest x-ray on 06/21/17 was normal.  He denies issues swallowing, pain.  Stridor is stable for several months.  ALLERGIES:  has No Known Allergies.  Meds: Current Outpatient Medications  Medication Sig Dispense Refill  . Lanthanum Carbonate (FOSRENOL PO) Take by mouth. Takes daily.    . risperiDONE (RISPERDAL) 3 MG tablet Take 1 tablet (3 mg total) by mouth daily. 90 tablet 0  . simvastatin (ZOCOR) 20 MG tablet Take 20 mg by mouth daily at 12 noon.     Marland Kitchen LANTUS SOLOSTAR 100 UNIT/ML injection Inject 28 Units into the skin at bedtime.     . nicotine (NICODERM CQ - DOSED IN MG/24 HOURS) 21 mg/24hr patch Place 21 mg onto the skin daily.    . ONE TOUCH ULTRA TEST test strip 1 each by Other route every evening.      No current facility-administered medications for this encounter.     Physical Findings: The patient is in no acute distress. Patient is alert and oriented. Wt Readings from Last 3 Encounters:  12/08/17 205 lb 9.6 oz (93.3 kg)  05/28/17 209 lb 12.8 oz (95.2 kg)  02/19/17 221 lb 12.8 oz (100.6 kg)    height is  5\' 10"  (1.778 m) and weight is 205 lb 9.6 oz (93.3 kg). His temperature is 97.9 F (36.6 C). His blood pressure is 115/72 and his pulse is 98. His respiration is 22 (abnormal) and oxygen saturation is 99%. .  General: Alert and oriented, in no acute distress HEENT: Head is normocephalic. Extraocular movements are intact. Oropharynx is clear. Tan coating on tongue. Stridor Neck: Neck is supple. Moderate lymphedema, no palpable masses. Heart: Regular in rate and rhythm with no murmurs, rubs, or gallops. Chest: Coarse sounds related to stridor Extremities: No cyanosis or edema. Lymphatics: see Neck Exam Skin: No concerning lesions.      Lab Findings: Lab Results  Component Value Date   WBC 10.9 (H) 08/26/2016   HGB 13.3 08/31/2016   HCT 39.0 08/31/2016   MCV 97.8 08/26/2016   PLT 244 08/26/2016    Lab Results  Component Value Date   TSH 1.483 12/08/2017    Radiographic Findings: No results found.  Impression/Plan: Supraglottic cancer  1) Head and Neck Cancer Status: NED though pt denied a laryngoscopy to internally monitor his status, against medical advice.  2) Nutritional Status: stable  PEG tube: none  3) Risk Factors: The patient has been educated about risk factors including alcohol and tobacco abuse; they understand that avoidance of alcohol and tobacco is important to prevent recurrences as well as  other cancers. {Patient is currently smoking and is not motivated to quit)  4) Swallowing: functional; patient does not have teeth which limits his food intake.  5) Thyroid function:  WNL Lab Results  Component Value Date   TSH 1.483 12/08/2017   From now on, his family will recheck annually with his PCP.  6) Follow-up in 1 year as he is declining laryngoscopy in our clinic and prefers to have this done in ENT.  See Dr. Lucia Gaskins every 4 months.    __________________________________   Eppie Gibson, MD  This document serves as a record of services personally performed  by Eppie Gibson, MD. It was created on his behalf by Wilburn Mylar, a trained medical scribe. The creation of this record is based on the scribe's personal observations and the provider's statements to them. This document has been checked and approved by the attending provider.

## 2017-12-09 LAB — TSH: TSH: 1.483 u[IU]/mL (ref 0.320–4.118)

## 2017-12-10 ENCOUNTER — Encounter: Payer: Self-pay | Admitting: Radiation Oncology

## 2018-02-02 ENCOUNTER — Telehealth (HOSPITAL_COMMUNITY): Payer: Self-pay

## 2018-02-02 NOTE — Telephone Encounter (Signed)
Patients mother is calling, patient has an appointment on Monday. She asked that you not mention a kidney transplant as the patient is not compliant and not eligible to be on the list. She states that when it is brought up patient gets hopeful and does not really understand that he is ineligible for a transplant and it causes tension between the two of them.

## 2018-02-07 ENCOUNTER — Encounter (HOSPITAL_COMMUNITY): Payer: Self-pay | Admitting: Psychiatry

## 2018-02-07 ENCOUNTER — Ambulatory Visit (INDEPENDENT_AMBULATORY_CARE_PROVIDER_SITE_OTHER): Payer: Medicare Other | Admitting: Psychiatry

## 2018-02-07 DIAGNOSIS — F17210 Nicotine dependence, cigarettes, uncomplicated: Secondary | ICD-10-CM | POA: Diagnosis not present

## 2018-02-07 DIAGNOSIS — F250 Schizoaffective disorder, bipolar type: Secondary | ICD-10-CM

## 2018-02-07 MED ORDER — RISPERIDONE 3 MG PO TABS: 3.0000 mg | ORAL_TABLET | Freq: Every day | ORAL | 0 refills | Status: DC

## 2018-02-07 NOTE — Progress Notes (Signed)
[place] MD/PA/NP OP Progress Note  02/07/2018 10:19 AM Kevin Deleon  MRN:  [id number]  Chief Complaint: I am taking my medication every day.  I am not paranoid.  HPI: Kevin Deleon came for his follow-up appointment with his mother.  He is taking Risperdal 3 mg every night and he believes it is much better than Abilify.  He denies any agitation, anger, mania, psychosis or any paranoia.  Some nights he has difficulty sleeping but he does not want to take any medicine for insomnia.  He continues to get dialysis on Tuesday, Thursday and Saturday.  He is seeing ENT regularly for his throat cancer.  He endorsed some time lack of appetite and he lost weight but his energy level is okay.  Denies any tremors, shakes or any EPS.  He is not interested in Risperdal Consta.  He denies drinking alcohol or using any illegal substances.  He has no issues with his neighbors.  His mother also endorsed that he is doing better on Risperdal.  Visit Diagnosis:    ICD-10-CM   1. Schizoaffective disorder, bipolar type ([place]) F25.0 risperiDONE (RISPERDAL) 3 MG tablet    Past Psychiatric History: Reviewed Patient has psychiatric illness since age 11. He has paranoia, mania, psychosis and disorganized thinking. He has at least 5 psychiatric hospitalization mostly due to noncompliance with medication. He was seeing [facility] mental health for many years. In the past he had tried Haldol which he actually likes but when the dose was increased to 15 mg he became zombie and stopped. He has taken lithium but stopped because of renal insufficiency. He also tried amitriptyline and Abilify but he had a best response with Risperdal.     Past Medical History:  Past Medical History:  Diagnosis Date  . Chronic kidney disease    dialysis Tue-Thur-Sat  . Diabetes mellitus ([place])   . Dialysis patient ([place])    T, TH, SAT at [facility]  . GERD (gastroesophageal reflux disease)   . History of radiation therapy 09/14/16- 11/03/16    supraglottis  . HTN (hypertension)    off BP meds since dialysis started 03-2016  . Hyperlipidemia   . Liver disease    patient denied 08/26/16  . Mental disorder    Schizo- affective disorder  . Schizophrenia ([place])    schizo-affective  . Smoker    quit one week ago  . Smokers' cough [facility])     Past Surgical History:  Procedure Laterality Date  . AV FISTULA PLACEMENT Left 05/24/2013   Procedure: ARTERIOVENOUS (AV) FISTULA CREATION BRACHIAL-CEPHALIC;  Surgeon: Conrad Ryegate, MD;  Location: [place];  Service: Vascular;  Laterality: Left;  . DIRECT LARYNGOSCOPY N/A 08/17/2016   Procedure: DIRECT LARYNGOSCOPY WITH BIOPSY AND FROZEN SECTION;  Surgeon: Rozetta Nunnery, MD;  Location: [place];  Service: ENT;  Laterality: N/A;  . MULTIPLE EXTRACTIONS WITH ALVEOLOPLASTY N/A 08/31/2016   Procedure: Extraction of tooth #'s 2-10, 13-15, and 19-30 with alveoloplasty;  Surgeon: Lenn Cal, DDS;  Location: [place];  Service: Oral Surgery;  Laterality: N/A;    Family Psychiatric History: Reviewed.  Family History:  Family History  Problem Relation Age of Onset  . Cancer - Lung Other   . Cancer Other     Social History:  Social History   Socioeconomic History  . Marital status: Single    Spouse name: Not on file  . Number of children: Not on file  . Years of education: Not on file  .  Highest education level: Not on file  Occupational History  . Not on file  Social Needs  . Financial resource strain: Not on file  . Food insecurity:    Worry: Not on file    Inability: Not on file  . Transportation needs:    Medical: Not on file    Non-medical: Not on file  Tobacco Use  . Smoking status: Current Some Day Smoker    Packs/day: 1.00    Years: 20.00    Pack years: 20.00    Types: Cigarettes    Last attempt to quit: 08/12/2016    Years since quitting: 1.4  . Smokeless tobacco: Never Used  . Tobacco comment: 5 cigarettes a week and trying to quit  Substance and  Sexual Activity  . Alcohol use: No    Alcohol/week: 0.0 standard drinks    Frequency: Never    Comment: occ  . Drug use: No  . Sexual activity: Not Currently  Lifestyle  . Physical activity:    Days per week: Not on file    Minutes per session: Not on file  . Stress: Not on file  Relationships  . Social connections:    Talks on phone: Not on file    Gets together: Not on file    Attends religious service: Not on file    Active member of club or organization: Not on file    Attends meetings of clubs or organizations: Not on file    Relationship status: Not on file  Other Topics Concern  . Not on file  Social History Narrative  . Not on file    Allergies: No Known Allergies  Metabolic Disorder Labs: Lab Results  Component Value Date   HGBA1C 7.1 (H) 08/26/2016   MPG 157 08/26/2016   No results found for: PROLACTIN No results found for: CHOL, TRIG, HDL, CHOLHDL, VLDL, LDLCALC Lab Results  Component Value Date   TSH 1.483 12/08/2017   TSH 0.978 02/15/2017    Therapeutic Level Labs: No results found for: LITHIUM No results found for: VALPROATE No components found for:  CBMZ  Current Medications: Current Outpatient Medications  Medication Sig Dispense Refill  . LANTUS SOLOSTAR 100 UNIT/ML injection Inject 28 Units into the skin at bedtime.     . ONE TOUCH ULTRA TEST test strip 1 each by Other route every evening.     . risperiDONE (RISPERDAL) 3 MG tablet Take 1 tablet (3 mg total) by mouth daily. 90 tablet 0  . simvastatin (ZOCOR) 20 MG tablet Take 20 mg by mouth daily at 12 noon.     . Lanthanum Carbonate (FOSRENOL PO) Take by mouth. Takes daily.    . nicotine (NICODERM CQ - DOSED IN MG/24 HOURS) 21 mg/24hr patch Place 21 mg onto the skin daily.     No current facility-administered medications for this visit.      Musculoskeletal: Strength & Muscle Tone: within normal limits Gait & Station: normal Patient leans: N/A  Psychiatric Specialty Exam: ROS   Blood pressure 112/80, pulse (!) 108, height 5\' 10"  (1.778 m), weight 198 lb (89.8 kg), SpO2 99 %.There is no height or weight on file to calculate BMI.  General Appearance: Casual  Eye Contact:  Fair  Speech:  Slow  Volume:  Decreased  Mood:  Euthymic  Affect:  Congruent  Thought Process:  Goal Directed  Orientation:  Full (Time, Place, and Person)  Thought Content: Logical   Suicidal Thoughts:  No  Homicidal Thoughts:  No  Memory:  Immediate;   Fair Recent;   Fair Remote;   Fair  Judgement:  Good  Insight:  Good  Psychomotor Activity:  Normal  Concentration:  Concentration: Fair and Attention Span: Fair  Recall:  [place] of Knowledge: Good  Language: Good  Akathisia:  No  Handed:  Right  AIMS (if indicated): not done  Assets:  Communication Skills Desire for Improvement Housing Social Support  ADL's:  Intact  Cognition: WNL  Sleep:  Fair   Screenings: PHQ2-9     Follow Up 20 from 12/08/2017 in [facility] Follow Up 20 from 05/28/2017 in [facility] Follow Up 20 from 02/19/2017 in [facility] Follow Up 20 from 11/16/2016 in [place] from 08/28/2016 in [facility]  PHQ-2 Total Score  0  0  0  0  0       Assessment and Plan: Schizophrenia chronic paranoid type.  Patient is a stable on his current medication.  Continue Risperdal 3 mg at bedtime.  He is not interested in counseling.  I recommended if he had persistent insomnia then we will consider giving low-dose Vistaril since trazodone did not help him in the past.  Patient has no side effects from the Risperdal.  Recommended to call us back if he has any question, concern or if he feels worsening of the symptoms.  Follow-up in 3 months.   Kathlee Nations, MD 02/07/2018, 10:19 AM

## 2018-03-28 ENCOUNTER — Other Ambulatory Visit (HOSPITAL_COMMUNITY): Payer: Self-pay | Admitting: Psychiatry

## 2018-03-28 DIAGNOSIS — F250 Schizoaffective disorder, bipolar type: Secondary | ICD-10-CM

## 2018-04-27 ENCOUNTER — Other Ambulatory Visit (HOSPITAL_COMMUNITY): Payer: Self-pay | Admitting: Psychiatry

## 2018-04-27 DIAGNOSIS — F250 Schizoaffective disorder, bipolar type: Secondary | ICD-10-CM

## 2018-05-02 ENCOUNTER — Ambulatory Visit (INDEPENDENT_AMBULATORY_CARE_PROVIDER_SITE_OTHER): Payer: Medicare Other | Admitting: Psychiatry

## 2018-05-02 ENCOUNTER — Encounter (HOSPITAL_COMMUNITY): Payer: Self-pay | Admitting: Psychiatry

## 2018-05-02 VITALS — BP 122/74 | Ht 70.35 in | Wt 202.0 lb

## 2018-05-02 DIAGNOSIS — F250 Schizoaffective disorder, bipolar type: Secondary | ICD-10-CM | POA: Diagnosis not present

## 2018-05-02 MED ORDER — RISPERIDONE 3 MG PO TABS: 3.0000 mg | ORAL_TABLET | Freq: Every day | ORAL | 0 refills | Status: DC

## 2018-05-02 NOTE — Progress Notes (Signed)
[place] MD/PA/NP OP Progress Note  05/02/2018 8:51 AM TUSHAR ENNS  MRN:  [id number]  Chief Complaint: I am doing fine.  I am sleeping good.  HPI: [place] came for his follow-up appointment with his mother.  He is taking his medication as prescribed.  He is sleeping good.  We have switch from Abilify 6 months ago because he was not feeling as good and he has still struggle with insomnia.  He feels that current medicine is working very well.  He is compliant with his dialysis and also seeing ENT regularly for his throat cancer.  His mother endorsed that there has been no recent agitation, paranoia, hallucination or any aggressive behavior.  His mother fix the pillbox every week.  He has no tremors, shakes or any EPS.  Even though he has decreased appetite but his energy level is okay.  He had a good Thanksgiving.  Patient denies any crying spells or any feeling of hopelessness or worthlessness.  He wants to continue current Risperdal.  Visit Diagnosis:    ICD-10-CM   1. Schizoaffective disorder, bipolar type ([place]) F25.0     Past Psychiatric History: Reviewed. History of psychiatric illness since age 69.  Multiple hospitalization due to noncompliance with medication resulting in paranoia, mania, psychosis and disorganized thinking.  Tried Haldol which she likes but when the dose was increased to 15 mg he became zombie.  Him helped but stopped due to renal insufficiency.  Tried amitriptyline and Abilify but Risperdal helped him the most.  Past Medical History:  Past Medical History:  Diagnosis Date  . Chronic kidney disease    dialysis Tue-Thur-Sat  . Diabetes mellitus ([place])   . Dialysis patient ([place])    T, TH, SAT at [facility]  . GERD (gastroesophageal reflux disease)   . 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 HTN (hypertension)    off BP meds since dialysis started 03-2016  . Hyperlipidemia   . Liver disease    patient denied 08/26/16  . Mental disorder     Schizo- affective disorder  . Schizophrenia ([place])    schizo-affective  . Smoker    quit one week ago  . Smokers' cough [facility])     Past Surgical History:  Procedure Laterality Date  . AV FISTULA PLACEMENT Left 05/24/2013   Procedure: ARTERIOVENOUS (AV) FISTULA CREATION BRACHIAL-CEPHALIC;  Surgeon: Conrad Refugio, MD;  Location: [place];  Service: Vascular;  Laterality: Left;  . DIRECT LARYNGOSCOPY N/A 08/17/2016   Procedure: DIRECT LARYNGOSCOPY WITH BIOPSY AND FROZEN SECTION;  Surgeon: Rozetta Nunnery, MD;  Location: [place];  Service: ENT;  Laterality: N/A;  . MULTIPLE EXTRACTIONS WITH ALVEOLOPLASTY N/A 08/31/2016   Procedure: Extraction of tooth #'s 2-10, 13-15, and 19-30 with alveoloplasty;  Surgeon: Lenn Cal, DDS;  Location: [place];  Service: Oral Surgery;  Laterality: N/A;    Family Psychiatric History: Reviewed.  Family History:  Family History  Problem Relation Age of Onset  . Cancer - Lung Other   . Cancer Other     Social History:  Social History   Socioeconomic History  . Marital status: Single    Spouse name: Not on file  . Number of children: Not on file  . Years of education: Not on file  . Highest education level: Not on file  Occupational History  . Not on file  Social Needs  . Financial resource strain: Not on file  . Food insecurity:  Worry: Not on file    Inability: Not on file  . Transportation needs:    Medical: Not on file    Non-medical: Not on file  Tobacco Use  . Smoking status: Current Some Day Smoker    Packs/day: 1.00    Years: 20.00    Pack years: 20.00    Types: Cigarettes    Last attempt to quit: 08/12/2016    Years since quitting: 1.7  . Smokeless tobacco: Never Used  . Tobacco comment: 5 cigarettes a week and trying to quit  Substance and Sexual Activity  . Alcohol use: No    Alcohol/week: 0.0 standard drinks    Frequency: Never    Comment: occ  . Drug use: No  . Sexual activity: Not Currently   Lifestyle  . Physical activity:    Days per week: Not on file    Minutes per session: Not on file  . Stress: Not on file  Relationships  . Social connections:    Talks on phone: Not on file    Gets together: Not on file    Attends religious service: Not on file    Active member of club or organization: Not on file    Attends meetings of clubs or organizations: Not on file    Relationship status: Not on file  Other Topics Concern  . Not on file  Social History Narrative  . Not on file    Allergies: No Known Allergies  Metabolic Disorder Labs: Lab Results  Component Value Date   HGBA1C 7.1 (H) 08/26/2016   MPG 157 08/26/2016   No results found for: PROLACTIN No results found for: CHOL, TRIG, HDL, CHOLHDL, VLDL, LDLCALC Lab Results  Component Value Date   TSH 1.483 12/08/2017   TSH 0.978 02/15/2017    Therapeutic Level Labs: No results found for: LITHIUM No results found for: VALPROATE No components found for:  CBMZ  Current Medications: Current Outpatient Medications  Medication Sig Dispense Refill  . Lanthanum Carbonate (FOSRENOL PO) Take by mouth. Takes daily.    Marland Kitchen LANTUS SOLOSTAR 100 UNIT/ML injection Inject 28 Units into the skin at bedtime.     . nicotine (NICODERM CQ - DOSED IN MG/24 HOURS) 21 mg/24hr patch Place 21 mg onto the skin daily.    . ONE TOUCH ULTRA TEST test strip 1 each by Other route every evening.     . risperiDONE (RISPERDAL) 3 MG tablet Take 1 tablet (3 mg total) by mouth daily. 90 tablet 0  . simvastatin (ZOCOR) 20 MG tablet Take 20 mg by mouth daily at 12 noon.      No current facility-administered medications for this visit.      Musculoskeletal: Strength & Muscle Tone: within normal limits Gait & Station: normal Patient leans: N/A  Psychiatric Specialty Exam: ROS  Blood pressure 122/74, height 5' 10.35" (1.787 m), weight 202 lb (91.6 kg).There is no height or weight on file to calculate BMI.  General Appearance: Fairly Groomed   Eye Contact:  Fair  Speech:  Slow  Volume:  Decreased  Mood:  Euthymic  Affect:  Congruent  Thought Process:  Descriptions of Associations: Intact  Orientation:  Full (Time, Place, and Person)  Thought Content: Logical   Suicidal Thoughts:  No  Homicidal Thoughts:  No  Memory:  Immediate;   Fair Recent;   Fair Remote;   Fair  Judgement:  Good  Insight:  Good  Psychomotor Activity:  Normal  Concentration:  Concentration: Fair and Attention Span:  Fair  Recall:  [place] of Knowledge: Good  Language: Good  Akathisia:  No  Handed:  Right  AIMS (if indicated): not done  Assets:  Communication Skills Desire for Improvement Housing Resilience Social Support  ADL's:  Intact  Cognition: WNL  Sleep:  Good   Screenings: PHQ2-9     Follow Up 20 from 12/08/2017 in [facility] Follow Up 20 from 05/28/2017 in [facility] Follow Up 20 from 02/19/2017 in [facility] Follow Up 20 from 11/16/2016 in [place] from 08/28/2016 in [facility]  PHQ-2 Total Score  0  0  0  0  0       Assessment and Plan: Schizoaffective disorder, bipolar type.    Patient is a stable on his current dose of Risperdal 3 mg at bedtime.  He has no tremors or any shakes.  He is not interested in therapy.  He is sleeping better.  He is getting regular blood work at dialysis center and he was told everything is okay.  He is not on transplant list because of throat cancer.  He does not want to change medication.  I will continue Risperdal 3 mg at bedtime.  Recommended to call us back if he has any question or any concern.  Follow-up in 3 months.   Kathlee Nations, MD 05/02/2018, 8:51 AM

## 2018-07-03 ENCOUNTER — Other Ambulatory Visit (HOSPITAL_COMMUNITY): Payer: Self-pay | Admitting: Psychiatry

## 2018-07-03 DIAGNOSIS — F250 Schizoaffective disorder, bipolar type: Secondary | ICD-10-CM

## 2018-07-25 ENCOUNTER — Encounter (HOSPITAL_COMMUNITY): Payer: Self-pay | Admitting: Psychiatry

## 2018-07-25 ENCOUNTER — Ambulatory Visit (INDEPENDENT_AMBULATORY_CARE_PROVIDER_SITE_OTHER): Payer: Medicare Other | Admitting: Psychiatry

## 2018-07-25 DIAGNOSIS — F250 Schizoaffective disorder, bipolar type: Secondary | ICD-10-CM

## 2018-07-25 MED ORDER — RISPERIDONE 3 MG PO TABS: 3.0000 mg | ORAL_TABLET | Freq: Every day | ORAL | 1 refills | Status: DC

## 2018-07-25 NOTE — Progress Notes (Signed)
[place] MD/PA/NP OP Progress Note  07/25/2018 10:38 AM Kevin Deleon  MRN:  [id number]  Chief Complaint: I am doing okay.  I need my refill.  HPI: Kevin Deleon came for his appointment with the mother.  He is taking risperidone as prescribed.  He like risperidone is better than Abilify.  He has no tremors shakes or any EPS.  He is compliant with his dialysis.  He does not want to talk about his throat cancer.  His mother endorsed that he is not agitated, paranoid, irritable or any aggressive behavior.  Patient usually remains quiet and answer yes and no when questions were asked.  His mother fix the pillbox every week.  His mother reported that he has been taking the medication and doing very well.  Patient denies any suicidal thoughts, crying spells or any feeling of hopelessness or worthlessness.  He likes to continue current Risperdal.  He does not want to change or reduce the dose.  He is not drinking or using any illegal substances.  Visit Diagnosis:    ICD-10-CM   1. Schizoaffective disorder, bipolar type ([place]) F25.0 risperiDONE (RISPERDAL) 3 MG tablet    Past Psychiatric History: Reviewed H/O psychiatric illness since age 48.  Multiple inpatient due to mania, psychosis, delusions and non-compliance with medication. Tried Haldol but higher dose made zombie, Lithium (stopped due to renal insufficiency), amitriptyline and Abilify. Risperdal helped the most.    Past Medical History:  Past Medical History:  Diagnosis Date  . Chronic kidney disease    dialysis Tue-Thur-Sat  . Diabetes mellitus ([place])   . Dialysis patient ([place])    T, TH, SAT at [facility]  . GERD (gastroesophageal reflux disease)   . 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 HTN (hypertension)    off BP meds since dialysis started 03-2016  . Hyperlipidemia   . Liver disease    patient denied 08/26/16  . Mental disorder    Schizo- affective disorder  . Schizophrenia ([place])    schizo-affective  .  Smoker    quit one week ago  . Smokers' cough [facility])     Past Surgical History:  Procedure Laterality Date  . AV FISTULA PLACEMENT Left 05/24/2013   Procedure: ARTERIOVENOUS (AV) FISTULA CREATION BRACHIAL-CEPHALIC;  Surgeon: Conrad [place], MD;  Location: [place];  Service: Vascular;  Laterality: Left;  . DIRECT LARYNGOSCOPY N/A 08/17/2016   Procedure: DIRECT LARYNGOSCOPY WITH BIOPSY AND FROZEN SECTION;  Surgeon: Rozetta Nunnery, MD;  Location: [place];  Service: ENT;  Laterality: N/A;  . MULTIPLE EXTRACTIONS WITH ALVEOLOPLASTY N/A 08/31/2016   Procedure: Extraction of tooth #'s 2-10, 13-15, and 19-30 with alveoloplasty;  Surgeon: Lenn Cal, DDS;  Location: [place];  Service: Oral Surgery;  Laterality: N/A;    Family Psychiatric History: Reviewed  Family History:  Family History  Problem Relation Age of Onset  . Cancer - Lung Other   . Cancer Other     Social History:  Social History   Socioeconomic History  . Marital status: Single    Spouse name: Not on file  . Number of children: Not on file  . Years of education: Not on file  . Highest education level: Not on file  Occupational History  . Not on file  Social Needs  . Financial resource strain: Not on file  . Food insecurity:    Worry: Not on file    Inability: Not on file  . Transportation needs:  Medical: Not on file    Non-medical: Not on file  Tobacco Use  . Smoking status: Current Some Day Smoker    Packs/day: 1.00    Years: 20.00    Pack years: 20.00    Types: Cigarettes    Last attempt to quit: 08/12/2016    Years since quitting: 1.9  . Smokeless tobacco: Never Used  . Tobacco comment: 5 cigarettes a week and trying to quit  Substance and Sexual Activity  . Alcohol use: No    Alcohol/week: 0.0 standard drinks    Frequency: Never    Comment: occ  . Drug use: No  . Sexual activity: Not Currently  Lifestyle  . Physical activity:    Days per week: Not on file    Minutes per  session: Not on file  . Stress: Not on file  Relationships  . Social connections:    Talks on phone: Not on file    Gets together: Not on file    Attends religious service: Not on file    Active member of club or organization: Not on file    Attends meetings of clubs or organizations: Not on file    Relationship status: Not on file  Other Topics Concern  . Not on file  Social History Narrative  . Not on file    Allergies: No Known Allergies  Metabolic Disorder Labs: Lab Results  Component Value Date   HGBA1C 7.1 (H) 08/26/2016   MPG 157 08/26/2016   No results found for: PROLACTIN No results found for: CHOL, TRIG, HDL, CHOLHDL, VLDL, LDLCALC Lab Results  Component Value Date   TSH 1.483 12/08/2017   TSH 0.978 02/15/2017    Therapeutic Level Labs: No results found for: LITHIUM No results found for: VALPROATE No components found for:  CBMZ  Current Medications: Current Outpatient Medications  Medication Sig Dispense Refill  . gabapentin (NEURONTIN) 100 MG capsule TK ONE C PO QHS    . glipiZIDE (GLUCOTROL) 5 MG tablet     . risperiDONE (RISPERDAL) 3 MG tablet Take 1 tablet (3 mg total) by mouth daily. 90 tablet 1  . simvastatin (ZOCOR) 20 MG tablet Take 20 mg by mouth daily at 12 noon.     Marland Kitchen LANTUS SOLOSTAR 100 UNIT/ML injection Inject 28 Units into the skin at bedtime.     . ONE TOUCH ULTRA TEST test strip 1 each by Other route every evening.      No current facility-administered medications for this visit.      Musculoskeletal: Strength & Muscle Tone: within normal limits Gait & Station: normal Patient leans: N/A  Psychiatric Specialty Exam: ROS  Blood pressure 132/78, height 5\' 11"  (1.803 m), weight 206 lb 6.4 oz (93.6 kg).Body mass index is 28.79 kg/m.  General Appearance: Casual and Guarded  Eye Contact:  Fair  Speech:  Slow  Volume:  Normal  Mood:  Euthymic  Affect:  Congruent  Thought Process:  Descriptions of Associations: Intact  Orientation:   Full (Time, Place, and Person)  Thought Content: WDL   Suicidal Thoughts:  No  Homicidal Thoughts:  No  Memory:  Immediate;   Good Recent;   Good Remote;   Good  Judgement:  Good  Insight:  Present  Psychomotor Activity:  Normal  Concentration:  Concentration: Fair and Attention Span: Fair  Recall:  [employer] of Knowledge: Good  Language: Good  Akathisia:  No  Handed:  Right  AIMS (if indicated): not done  Assets:  Communication  Skills Housing Social Support  ADL's:  Intact  Cognition: WNL  Sleep:  Good   Screenings: PHQ2-9     Follow Up 20 from 12/08/2017 in [facility] Follow Up 20 from 05/28/2017 in [facility] Follow Up 20 from 02/19/2017 in [facility] Follow Up 20 from 11/16/2016 in [place] from 08/28/2016 in [facility]  PHQ-2 Total Score  0  0  0  0  0       Assessment and Plan: Schizoaffective disorder, bipolar type.  Patient is a stable on his medication.  As per his mother there has been no recent episode of aggression, severe mood swing and he is taking the medication as prescribed.  He is not interested in therapy.  We talked about reducing the dose but patient and his mother feel the current dose is working and there is no need to change the dose.  Patient has no tremors, shakes or any EPS.  He is getting blood work at the dialysis center.  I will continue Risperdal 3 mg at bedtime.  Discussed medication side effects and benefits.  Recommended to call us back if is any question or any concern.  We discussed to make appointment in 6 months as patient is stable however if he has any issues, concern or worsening of the symptoms and he can call us to get an earlier appointment.   Kathlee Nations, MD 07/25/2018, 10:38 AM

## 2018-08-17 IMAGING — CT CT NECK W/ CM
4 of 6 series · 14 of 33 positions shown, 16 images · IV contrast (75CC ISOVUE 300)
Comparison: None.

CLINICAL DATA: Dysphagia. Hoarseness. Sore throat. Duration of
symptoms 1 month.

EXAM:
CT NECK WITH CONTRAST
TECHNIQUE: Multidetector CT imaging of the neck was performed using the
standard protocol following the bolus administration of intravenous
contrast.
CONTRAST:  75mL YFA5G3-GWW IOPAMIDOL (YFA5G3-GWW) INJECTION 61%

[Series 3: axial neck · axial · 0.40mm/px · z∈[-2,+78]mm · 2 of 98 slices shown]
[im 33/98  bone]
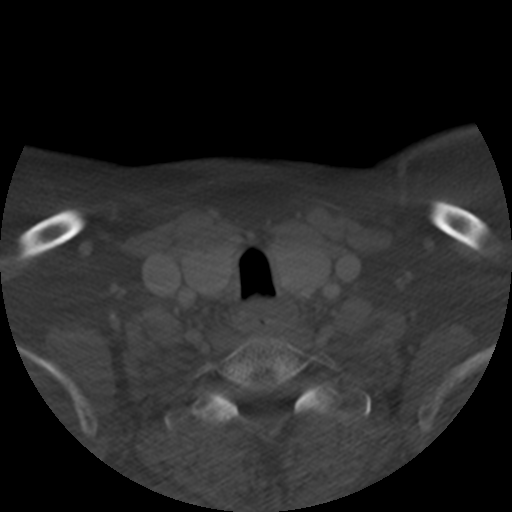
[im 65/98  bone]
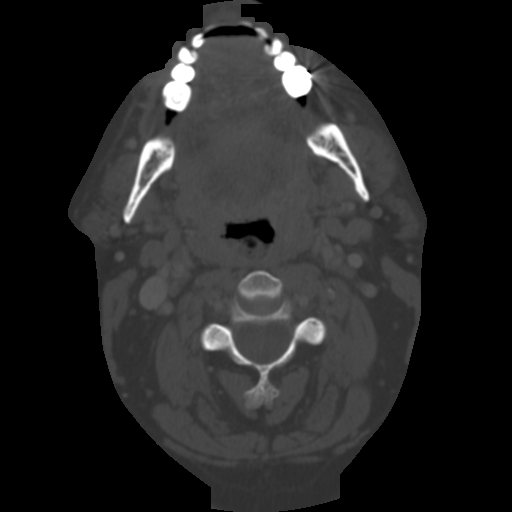

[Series 200: sagittal · sagittal · 0.49mm/px · 5 of 98 slices shown, 6 images]
[im 33/98  bone]
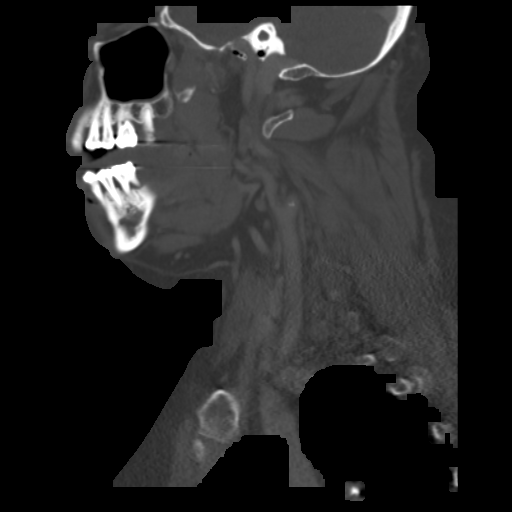
[im 41/98  bone]
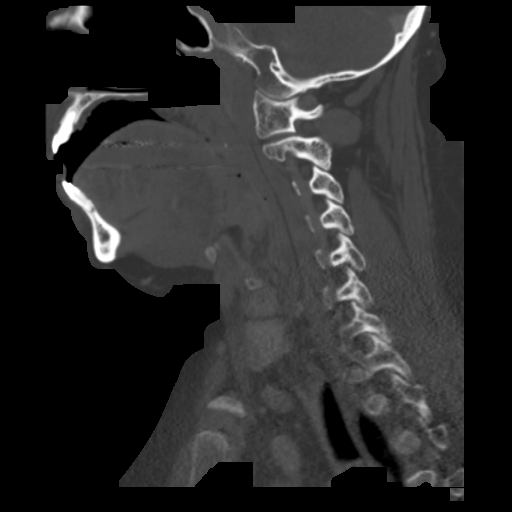
[im 49/98  soft-tissue]
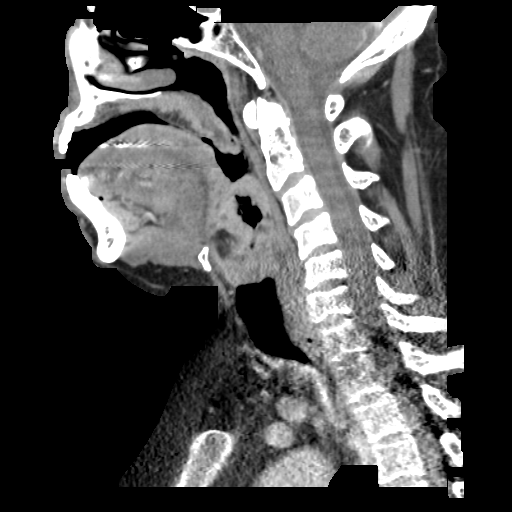
[im 49/98  bone]
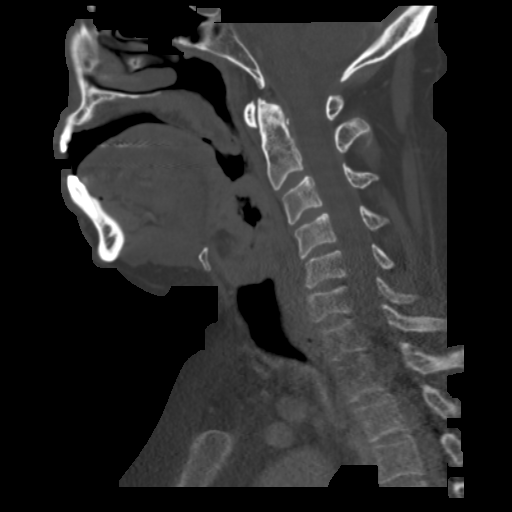
[im 57/98  bone]
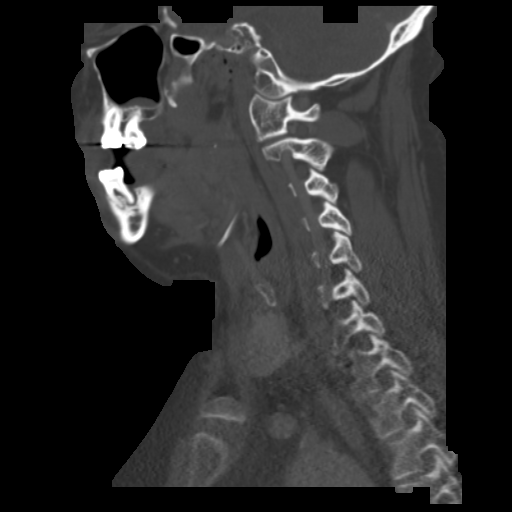
[im 65/98  bone]
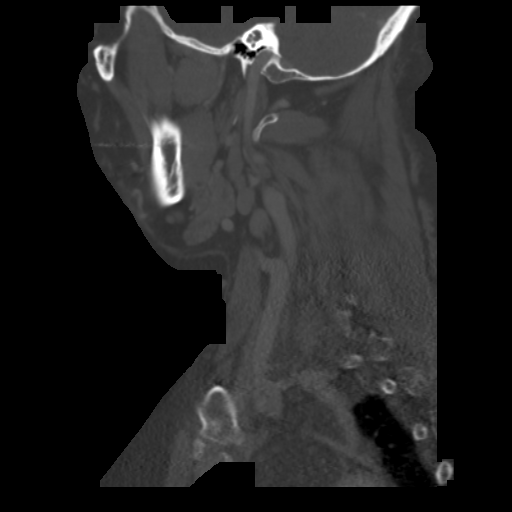

[Series 201: coronal · coronal · 0.49mm/px · 3 of 98 slices shown]
[im 20/98  bone]
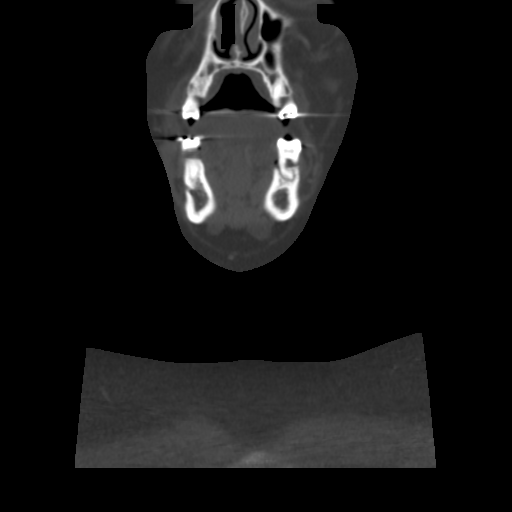
[im 39/98  bone]
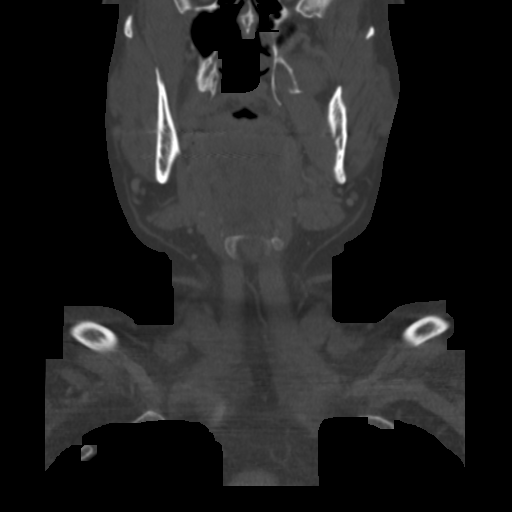
[im 59/98  bone]
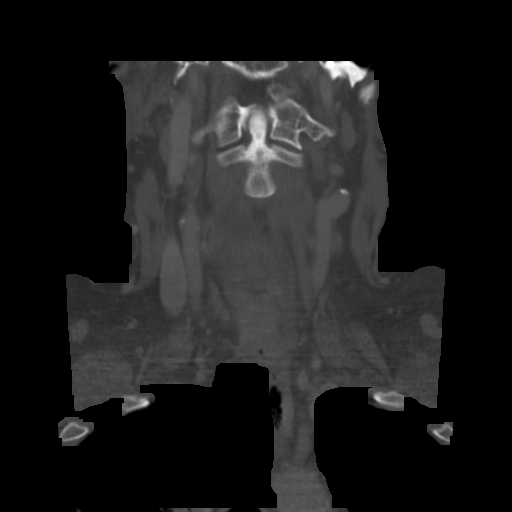

[Series 202: axial · axial · 0.49mm/px · z∈[-92,+56]mm · 4 of 134 slices shown, 5 images]
[im 27/134  soft-tissue]
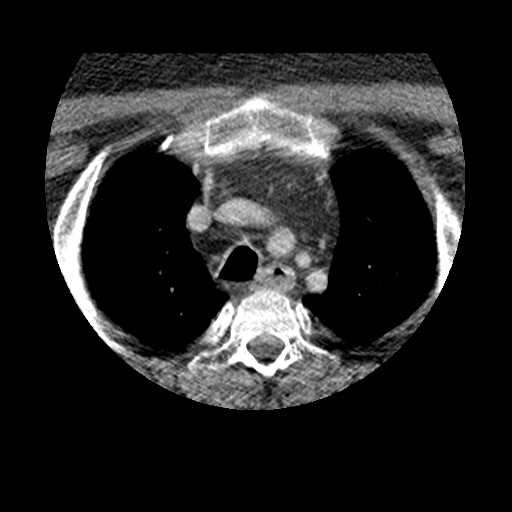
[im 27/134  bone]
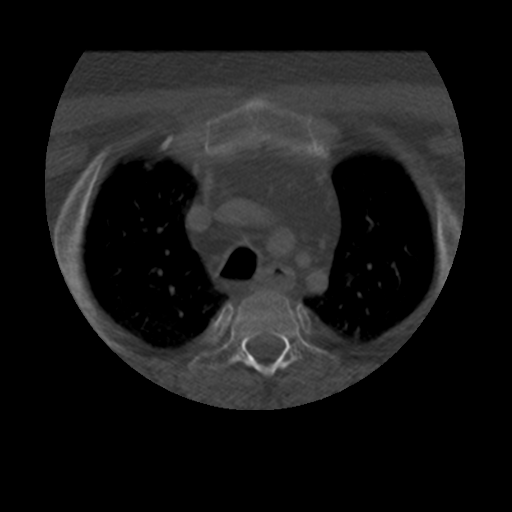
[im 54/134  bone]
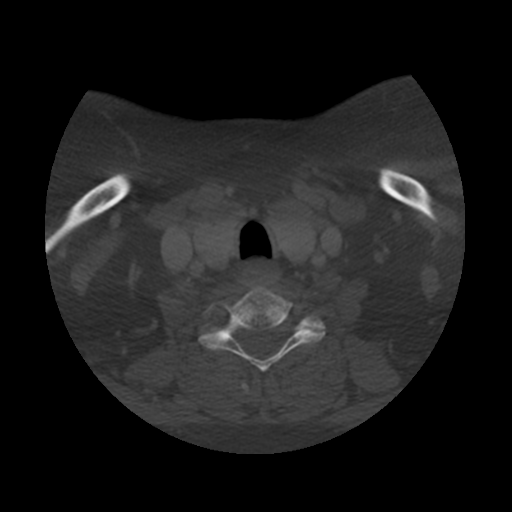
[im 80/134  bone]
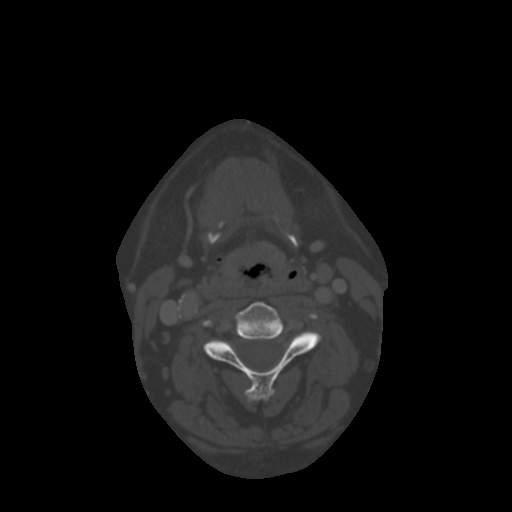
[im 107/134  bone]
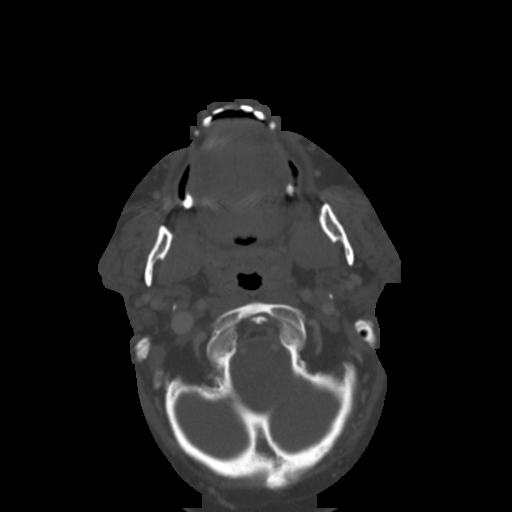

[14 of 33 positions shown; findings below may reference images not displayed]

FINDINGS: Pharynx and larynx: Extensive inferior oropharyngeal/supraglottic
mass with circumferential extension extending over a length of
almost 5 cm including involvement of the epiglottis. No glottic or
subglottic mass.

Salivary glands: Parotid and submandibular glands are normal.

Thyroid: Normal

Lymph nodes: No enlarged or low-density nodes on either side of the
neck.

Vascular: Carotid bifurcation atherosclerosis.

Limited intracranial: Normal

Visualized orbits: Normal

Mastoids and visualized paranasal sinuses: Clear

Skeleton: Normal

Upper chest: Normal

Other: None
IMPRESSION: Circumferential mass lesion at the inferior oropharynx/supraglottic
region extending over a length of almost 5 cm. Epiglottic
involvement. This likely represents an advanced carcinoma. Lymph
nodes are within normal limits in size and density.

## 2018-12-14 ENCOUNTER — Ambulatory Visit: Payer: Self-pay | Admitting: Radiation Oncology

## 2018-12-19 ENCOUNTER — Encounter: Payer: Self-pay | Admitting: *Deleted

## 2018-12-19 NOTE — Progress Notes (Signed)
Oncology Nurse Navigator Documentation  Flowsheet/spreadsheet update.  Rick Camy Leder, RN, BSN Head & Neck Oncology Nurse Navigator [facility] [place] [phone]   

## 2019-01-02 ENCOUNTER — Other Ambulatory Visit (HOSPITAL_COMMUNITY): Payer: Self-pay | Admitting: Psychiatry

## 2019-01-02 DIAGNOSIS — F250 Schizoaffective disorder, bipolar type: Secondary | ICD-10-CM

## 2019-01-12 ENCOUNTER — Other Ambulatory Visit (HOSPITAL_COMMUNITY): Payer: Self-pay

## 2019-01-12 DIAGNOSIS — F250 Schizoaffective disorder, bipolar type: Secondary | ICD-10-CM

## 2019-01-12 MED ORDER — RISPERIDONE 3 MG PO TABS: 3.0000 mg | ORAL_TABLET | Freq: Every day | ORAL | 0 refills | Status: DC

## 2019-01-25 ENCOUNTER — Ambulatory Visit (HOSPITAL_COMMUNITY): Payer: Medicare Other | Admitting: Psychiatry

## 2019-01-30 ENCOUNTER — Ambulatory Visit (HOSPITAL_COMMUNITY): Payer: Medicare Other | Admitting: Psychiatry

## 2019-01-31 ENCOUNTER — Ambulatory Visit (INDEPENDENT_AMBULATORY_CARE_PROVIDER_SITE_OTHER): Payer: Medicare Other | Admitting: Psychiatry

## 2019-01-31 ENCOUNTER — Other Ambulatory Visit: Payer: Self-pay

## 2019-01-31 DIAGNOSIS — F250 Schizoaffective disorder, bipolar type: Secondary | ICD-10-CM

## 2019-01-31 NOTE — Progress Notes (Signed)
Appointment was scheduled by patient's mother.  She told that patient is currently in dialysis and she made appointment so she can talk to me.  Apparently there is a questionable compliance of the Risperdal Consta as patient continued to have irritability, mood swing and lately cutting down his dialysis treatment.  He gets frustrated and short with the staff.  Though mother reported he denies any suicidal thoughts or homicidal thought back get frustrated and upset easily.  Patient's mother believes that due to dialysis his medicine is not absorbing very well.  Patient has a history of poor compliance with medication resulted in inpatient treatment.  I discussed with the mother to have the option of injectables.  He had been given Abilify injection but did not like Abilify.  I offered that we can try Risperdal Consta, in [place] but she is not sure if patient agreed with the injection.  We also talked about referred to ACT services suspicion has a history of noncompliance and being violent and extremely paranoid.  I explained if he accepted by ACT services then they can give him injection at his residence and he will be closely follow-up with them.  Patient's mother agreed with the plan.  We will refer him to ACT services in [place].

## 2019-02-07 ENCOUNTER — Other Ambulatory Visit: Payer: Self-pay | Admitting: Family Medicine

## 2019-02-07 ENCOUNTER — Ambulatory Visit
Admission: RE | Admit: 2019-02-07 | Discharge: 2019-02-07 | Disposition: A | Discharge status: Home / Self Care | Payer: Medicare Other | Source: Ambulatory Visit | Attending: Family Medicine | Admitting: Family Medicine

## 2019-02-07 DIAGNOSIS — M25512 Pain in left shoulder: Secondary | ICD-10-CM

## 2019-03-02 ENCOUNTER — Other Ambulatory Visit (HOSPITAL_COMMUNITY): Payer: Self-pay | Admitting: Psychiatry

## 2019-03-02 DIAGNOSIS — F250 Schizoaffective disorder, bipolar type: Secondary | ICD-10-CM

## 2019-03-18 ENCOUNTER — Encounter (INDEPENDENT_AMBULATORY_CARE_PROVIDER_SITE_OTHER): Payer: Self-pay

## 2019-03-22 ENCOUNTER — Ambulatory Visit (INDEPENDENT_AMBULATORY_CARE_PROVIDER_SITE_OTHER): Payer: Medicare Other | Admitting: Otolaryngology

## 2019-03-28 ENCOUNTER — Ambulatory Visit (HOSPITAL_COMMUNITY): Payer: Medicare Other | Admitting: Psychiatry

## 2019-03-30 ENCOUNTER — Other Ambulatory Visit (HOSPITAL_COMMUNITY): Payer: Self-pay | Admitting: Psychiatry

## 2019-03-30 ENCOUNTER — Other Ambulatory Visit (HOSPITAL_COMMUNITY): Payer: Self-pay

## 2019-03-30 DIAGNOSIS — F250 Schizoaffective disorder, bipolar type: Secondary | ICD-10-CM

## 2019-03-30 MED ORDER — RISPERIDONE 3 MG PO TABS: 3.0000 mg | ORAL_TABLET | Freq: Every day | ORAL | 0 refills | Status: DC

## 2019-04-06 ENCOUNTER — Other Ambulatory Visit: Payer: Self-pay

## 2019-04-06 ENCOUNTER — Emergency Department (HOSPITAL_COMMUNITY)
Admission: EM | Admit: 2019-04-06 | Discharge: 2019-04-07 | Disposition: A | Discharge status: Home / Self Care | Payer: Medicare Other | Attending: Emergency Medicine | Admitting: Emergency Medicine

## 2019-04-06 DIAGNOSIS — Z7984 Long term (current) use of oral hypoglycemic drugs: Secondary | ICD-10-CM | POA: Diagnosis not present

## 2019-04-06 DIAGNOSIS — Z79899 Other long term (current) drug therapy: Secondary | ICD-10-CM | POA: Insufficient documentation

## 2019-04-06 DIAGNOSIS — Z6831 Body mass index (BMI) 31.0-31.9, adult: Secondary | ICD-10-CM | POA: Insufficient documentation

## 2019-04-06 DIAGNOSIS — I120 Hypertensive chronic kidney disease with stage 5 chronic kidney disease or end stage renal disease: Secondary | ICD-10-CM | POA: Diagnosis not present

## 2019-04-06 DIAGNOSIS — Z720 Tobacco use: Secondary | ICD-10-CM | POA: Diagnosis not present

## 2019-04-06 DIAGNOSIS — Z046 Encounter for general psychiatric examination, requested by authority: Secondary | ICD-10-CM | POA: Diagnosis present

## 2019-04-06 DIAGNOSIS — Z992 Dependence on renal dialysis: Secondary | ICD-10-CM | POA: Insufficient documentation

## 2019-04-06 DIAGNOSIS — R45850 Homicidal ideations: Secondary | ICD-10-CM | POA: Diagnosis not present

## 2019-04-06 DIAGNOSIS — Z20828 Contact with and (suspected) exposure to other viral communicable diseases: Secondary | ICD-10-CM | POA: Insufficient documentation

## 2019-04-06 DIAGNOSIS — N186 End stage renal disease: Secondary | ICD-10-CM | POA: Diagnosis not present

## 2019-04-06 DIAGNOSIS — F259 Schizoaffective disorder, unspecified: Secondary | ICD-10-CM | POA: Insufficient documentation

## 2019-04-06 DIAGNOSIS — E1122 Type 2 diabetes mellitus with diabetic chronic kidney disease: Secondary | ICD-10-CM | POA: Diagnosis not present

## 2019-04-06 DIAGNOSIS — E669 Obesity, unspecified: Secondary | ICD-10-CM | POA: Insufficient documentation

## 2019-04-06 LAB — COMPREHENSIVE METABOLIC PANEL
ALT: 16 U/L (ref 0–44)
AST: 15 U/L (ref 15–41)
Albumin: 3.9 g/dL (ref 3.5–5.0)
Alkaline Phosphatase: 110 U/L (ref 38–126)
Anion gap: 19 — ABNORMAL HIGH (ref 5–15)
BUN: 24 mg/dL — ABNORMAL HIGH (ref 6–20)
CO2: 26 mmol/L (ref 22–32)
Calcium: 9 mg/dL (ref 8.9–10.3)
Chloride: 89 mmol/L — ABNORMAL LOW (ref 98–111)
Creatinine, Ser: 4.99 mg/dL — ABNORMAL HIGH (ref 0.61–1.24)
GFR calc Af Amer: 15 mL/min — ABNORMAL LOW (ref >60)
GFR calc non Af Amer: 13 mL/min — ABNORMAL LOW (ref >60)
Glucose, Bld: 217 mg/dL — ABNORMAL HIGH (ref 70–99)
Potassium: 3.9 mmol/L (ref 3.5–5.1)
Sodium: 134 mmol/L — ABNORMAL LOW (ref 135–145)
Total Bilirubin: 0.4 mg/dL (ref 0.3–1.2)
Total Protein: 8 g/dL (ref 6.5–8.1)

## 2019-04-06 LAB — HEMOGLOBIN A1C
Hgb A1c MFr Bld: 6.9 % — ABNORMAL HIGH (ref 4.8–5.6)
Mean Plasma Glucose: 151.33 mg/dL

## 2019-04-06 LAB — ACETAMINOPHEN LEVEL: Acetaminophen (Tylenol), Serum: <10 ug/mL — ABNORMAL LOW (ref 10–30)

## 2019-04-06 LAB — CBC
HCT: 37.3 % — ABNORMAL LOW (ref 39.0–52.0)
Hemoglobin: 12.4 g/dL — ABNORMAL LOW (ref 13.0–17.0)
MCH: 34.1 pg — ABNORMAL HIGH (ref 26.0–34.0)
MCHC: 33.2 g/dL (ref 30.0–36.0)
MCV: 102.5 fL — ABNORMAL HIGH (ref 80.0–100.0)
Platelets: 271 10*3/uL (ref 150–400)
RBC: 3.64 MIL/uL — ABNORMAL LOW (ref 4.22–5.81)
RDW: 13.8 % (ref 11.5–15.5)
WBC: 11.3 10*3/uL — ABNORMAL HIGH (ref 4.0–10.5)
nRBC: 0 % (ref 0.0–0.2)

## 2019-04-06 LAB — SALICYLATE LEVEL: Salicylate Lvl: <7 mg/dL (ref 2.8–30.0)

## 2019-04-06 LAB — ETHANOL: Alcohol, Ethyl (B): <10 mg/dL (ref <10)

## 2019-04-06 LAB — SARS CORONAVIRUS 2 (TAT 6-24 HRS): SARS Coronavirus 2: NEGATIVE

## 2019-04-06 MED ORDER — INSULIN ASPART 100 UNIT/ML ~~LOC~~ SOLN: 0.0000 [IU] | Freq: Every day | SUBCUTANEOUS | Status: DC

## 2019-04-06 MED ORDER — NICOTINE 21 MG/24HR TD PT24
21.0000 mg | MEDICATED_PATCH | Freq: Every day | TRANSDERMAL | Status: DC
  Administered 2019-04-06 – 2019-04-07 (×2): 21 mg via TRANSDERMAL
  Filled 2019-04-06 (×2): qty 1

## 2019-04-06 MED ORDER — ALUM & MAG HYDROXIDE-SIMETH 200-200-20 MG/5ML PO SUSP: 30.0000 mL | Freq: Four times a day (QID) | ORAL | Status: DC | PRN

## 2019-04-06 MED ORDER — INSULIN ASPART 100 UNIT/ML ~~LOC~~ SOLN
0.0000 [IU] | Freq: Three times a day (TID) | SUBCUTANEOUS | Status: DC
  Administered 2019-04-07: 4 [IU] via SUBCUTANEOUS

## 2019-04-06 MED ORDER — ZOLPIDEM TARTRATE 5 MG PO TABS
5.0000 mg | ORAL_TABLET | Freq: Every evening | ORAL | Status: DC | PRN
  Filled 2019-04-06: qty 1

## 2019-04-06 MED ORDER — ONDANSETRON HCL 4 MG PO TABS: 4.0000 mg | ORAL_TABLET | Freq: Three times a day (TID) | ORAL | Status: DC | PRN

## 2019-04-06 MED ORDER — RISPERIDONE 3 MG PO TABS
3.0000 mg | ORAL_TABLET | Freq: Every day | ORAL | Status: DC
  Administered 2019-04-07: 3 mg via ORAL
  Filled 2019-04-06 (×2): qty 1

## 2019-04-06 MED ORDER — ACETAMINOPHEN 325 MG PO TABS: 650.0000 mg | ORAL_TABLET | ORAL | Status: DC | PRN

## 2019-04-06 MED ORDER — GABAPENTIN 100 MG PO CAPS
100.0000 mg | ORAL_CAPSULE | Freq: Every day | ORAL | Status: DC
  Filled 2019-04-06: qty 1

## 2019-04-06 NOTE — ED Provider Notes (Signed)
[facility] EMERGENCY DEPARTMENT Provider Note   CSN: [id number] Arrival date & time: 04/06/19  1604     History   Chief Complaint Chief Complaint  Patient presents with  . IVC    HPI Kevin Deleon is a 48 y.o. male.     The history is provided by the patient, medical records and the police. No language interpreter was used.     48 year old male with history of schizophrenia, end-stage renal disease currently a Tuesday/Thursday/Saturday dialysis, hypertension, who was IVC by his mom for homicidal ideation.  Per note from [employer], patient was threatened to kill his mom and he was IVC by mother.  Patient however denies wanting to harm his mom.  He does not know why he is in the hospital.  He does not have any specific complaint.  He denies suicidal ideation auditory or visual hallucination.  He reports being compliant with his medication.  States that he has dialysis today.  Does not have any other complaint.  Admits to tobacco use but denies alcohol or street drug use.  Patient denies any recent sick contact.  Past Medical History:  Diagnosis Date  . Chronic kidney disease    dialysis Tue-Thur-Sat  . Diabetes mellitus ([place])   . Dialysis patient ([place])    T, TH, SAT at [facility]  . GERD (gastroesophageal reflux disease)   . 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 HTN (hypertension)    off BP meds since dialysis started 03-2016  . Hyperlipidemia   . Liver disease    patient denied 08/26/16  . Mental disorder    Schizo- affective disorder  . Schizophrenia ([place])    schizo-affective  . Smoker    quit one week ago  . Smokers' cough [facility])     Patient Active Problem List   Diagnosis Date Noted  . Small bowel lesion 02/19/2017  . 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 Schizoaffective disorder, bipolar type ([place]) 02/24/2016  . End stage renal disease ([place]) 07/07/2013  . Chronic kidney disease, stage IV (severe) ([place])  05/05/2013  . Chronic kidney disease (CKD), stage IV (severe) ([place]) 10/11/2012  . Obesity, unspecified 08/09/2012  . Diabetes mellitus ([place]) 08/09/2012  . HTN (hypertension) 08/09/2012  . Hyperlipidemia 08/09/2012    Past Surgical History:  Procedure Laterality Date  . AV FISTULA PLACEMENT Left 05/24/2013   Procedure: ARTERIOVENOUS (AV) FISTULA CREATION BRACHIAL-CEPHALIC;  Surgeon: Conrad Adjuntas, MD;  Location: [place];  Service: Vascular;  Laterality: Left;  . DIRECT LARYNGOSCOPY N/A 08/17/2016   Procedure: DIRECT LARYNGOSCOPY WITH BIOPSY AND FROZEN SECTION;  Surgeon: Rozetta Nunnery, MD;  Location: [place];  Service: ENT;  Laterality: N/A;  . MULTIPLE EXTRACTIONS WITH ALVEOLOPLASTY N/A 08/31/2016   Procedure: Extraction of tooth #'s 2-10, 13-15, and 19-30 with alveoloplasty;  Surgeon: Lenn Cal, DDS;  Location: [place];  Service: Oral Surgery;  Laterality: N/A;        Home Medications    Prior to Admission medications   Medication Sig Start Date End Date Taking? Authorizing Provider  gabapentin (NEURONTIN) 100 MG capsule TK ONE C PO QHS 06/26/18   [provider]  glipiZIDE (GLUCOTROL) 5 MG tablet  07/04/18   [provider]  LANTUS SOLOSTAR 100 UNIT/ML injection Inject 28 Units into the skin at bedtime.  05/30/12   [provider]  ONE TOUCH ULTRA TEST test strip 1 each by Other route every evening.  04/10/16  [provider]  risperiDONE (RISPERDAL) 3 MG tablet Take 1 tablet (3 mg total) by mouth daily. 03/30/19   Arfeen, Arlyce Harman, MD  simvastatin (ZOCOR) 20 MG tablet Take 20 mg by mouth daily at 12 noon.  06/21/12   [provider]    Family History Family History  Problem Relation Age of Onset  . Cancer - Lung Other   . Cancer Other     Social History Social History   Tobacco Use  . Smoking status: Current Some Day Smoker    Packs/day: 1.00    Years: 20.00    Pack years: 20.00    Types: Cigarettes     Last attempt to quit: 08/12/2016    Years since quitting: 2.6  . Smokeless tobacco: Never Used  . Tobacco comment: 5 cigarettes a week and trying to quit  Substance Use Topics  . Alcohol use: No    Alcohol/week: 0.0 standard drinks    Frequency: Never    Comment: occ  . Drug use: No     Allergies   Patient has no known allergies.   Review of Systems Review of Systems  All other systems reviewed and are negative.    Physical Exam Updated Vital Signs BP 91/69   Pulse 95   Temp 97.6 F (36.4 C) (Oral)   Resp (!) 22   Ht 5\' 11"  (1.803 m)   Wt 102.1 kg   SpO2 100%   BMI 31.38 kg/m   Physical Exam Vitals signs and nursing note reviewed.  Constitutional:      General: He is not in acute distress.    Appearance: He is well-developed. He is obese.     Comments: Resting comfortably in bed in no acute discomfort.  HENT:     Head: Atraumatic.  Eyes:     Conjunctiva/sclera: Conjunctivae normal.  Neck:     Musculoskeletal: Neck supple.  Cardiovascular:     Rate and Rhythm: Normal rate and regular rhythm.     Pulses: Normal pulses.     Heart sounds: Normal heart sounds.  Pulmonary:     Effort: Pulmonary effort is normal.     Breath sounds: Normal breath sounds.  Abdominal:     Palpations: Abdomen is soft.  Skin:    Findings: No rash.  Neurological:     Mental Status: He is alert and oriented to person, place, and time.     GCS: GCS eye subscore is 4. GCS verbal subscore is 5. GCS motor subscore is 6.  Psychiatric:        Attention and Perception: Attention normal.        Mood and Affect: Mood normal.        Speech: Speech normal.        Behavior: Behavior is cooperative.        Thought Content: Thought content is not paranoid. Thought content does not include homicidal or suicidal ideation.      ED Treatments / Results  Labs (all labs ordered are listed, but only abnormal results are displayed) Labs Reviewed  COMPREHENSIVE METABOLIC PANEL - Abnormal;  Notable for the following components:      Result Value   Sodium 134 (*)    Chloride 89 (*)    Glucose, Bld 217 (*)    BUN 24 (*)    Creatinine, Ser 4.99 (*)    GFR calc non Af Amer 13 (*)    GFR calc Af Amer 15 (*)    Anion gap  19 (*)    All other components within normal limits  ACETAMINOPHEN LEVEL - Abnormal; Notable for the following components:   Acetaminophen (Tylenol), Serum <10 (*)    All other components within normal limits  CBC - Abnormal; Notable for the following components:   WBC 11.3 (*)    RBC 3.64 (*)    Hemoglobin 12.4 (*)    HCT 37.3 (*)    MCV 102.5 (*)    MCH 34.1 (*)    All other components within normal limits  HEMOGLOBIN A1C - Abnormal; Notable for the following components:   Hgb A1c MFr Bld 6.9 (*)    All other components within normal limits  SARS CORONAVIRUS 2 (TAT 6-24 HRS)  ETHANOL  SALICYLATE LEVEL  RAPID URINE DRUG SCREEN, HOSP PERFORMED    EKG None  Radiology No results found.  Procedures Procedures (including critical care time)  Medications Ordered in ED Medications  acetaminophen (TYLENOL) tablet 650 mg (has no administration in time range)  zolpidem (AMBIEN) tablet 5 mg (has no administration in time range)  ondansetron (ZOFRAN) tablet 4 mg (has no administration in time range)  alum & mag hydroxide-simeth (MAALOX/MYLANTA) 200-200-20 MG/5ML suspension 30 mL (has no administration in time range)  nicotine (NICODERM CQ - dosed in mg/24 hours) patch 21 mg (21 mg Transdermal Patch Applied 04/06/19 1853)  insulin aspart (novoLOG) injection 0-20 Units (has no administration in time range)  insulin aspart (novoLOG) injection 0-5 Units (has no administration in time range)  gabapentin (NEURONTIN) capsule 100 mg (has no administration in time range)  risperiDONE (RISPERDAL) tablet 3 mg (0 mg Oral Hold 04/06/19 1851)     Initial Impression / Assessment and Plan / ED Course  I have reviewed the triage vital signs and the nursing notes.   Pertinent labs & imaging results that were available during my care of the patient were reviewed by me and considered in my medical decision making (see chart for details).        BP 91/69   Pulse 95   Temp 97.6 F (36.4 C) (Oral)   Resp (!) 22   Ht 5\' 11"  (1.803 m)   Wt 102.1 kg   SpO2 100%   BMI 31.38 kg/m    Final Clinical Impressions(s) / ED Diagnoses   Final diagnoses:  Homicidal ideation    ED Discharge Orders    None     5:59 PM Patient here with IVC papers indicating that he is threatened to harm his mom.  Patient currently denies having such thoughts.  He is in no acute discomfort.  He is a dialysis patient but finishes dialysis session today.  Labs are reassuring.  Patient is medically clear for further psychiatric evaluation.  Sitter is at bedside.   Domenic Moras, PA-C 04/06/19 2205    Blanchie Dessert, MD 04/06/19 2312

## 2019-04-06 NOTE — ED Notes (Signed)
TTS in progress 

## 2019-04-06 NOTE — ED Notes (Signed)
Pt ambulated to purple zone. Bed moved to room 50 for comfort. Pt denies pain. Pt short with answers, grumpy. Requests lights off and to leave him alone. Sitter at bedside.

## 2019-04-06 NOTE — BH Assessment (Addendum)
Tele Assessment Note   Patient Name: Kevin Deleon MRN: [id number] Referring Physician: Domenic Moras, PA-C. Location of Patient: [facility], [phone]. Location of Provider: [place] is an 48 y.o. male, who presents involuntary and unaccompanied to [facility]. Pt was a poor historian. Clinician asked the pt, "what brought you to the hospital?" Pt reported, "I have no idea." Pt denies, SI, HI, AVH, self-injurious behaviors and access to weapons.   Pt denies, abuse and substance use. Pt's UDS is pending. Pt is linked to Dr. Adele Schilder for medication management. Pt reports, taking medications as prescribed. Pt reported, previous inpatient admissions, unsure .   Pt presents quiet, awake in scrubs with logical, coherent speech. Pt's eye contact was poor. Pt's mood was irritable. Pt's affect was flat. Pt's thought process was coherent, relevant. Pt's judgement was partial. Pt was oriented x3. Pt's concentration was normal. Pt's insight and impulse control was poor. Pt reported, if discharged from [facility] he could contract for safety.   Diagnosis: Schizoaffective disorder, bipolar type [facility])   Past Medical History:  Past Medical History:  Diagnosis Date  . Chronic kidney disease    dialysis Tue-Thur-Sat  . Diabetes mellitus ([place])   . Dialysis patient ([place])    T, TH, SAT at [facility]  . GERD (gastroesophageal reflux disease)   . 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 HTN (hypertension)    off BP meds since dialysis started 03-2016  . Hyperlipidemia   . Liver disease    patient denied 08/26/16  . Mental disorder    Schizo- affective disorder  . Schizophrenia ([place])    schizo-affective  . Smoker    quit one week ago  . Smokers' cough [facility])     Past Surgical History:  Procedure Laterality Date  . AV FISTULA PLACEMENT Left 05/24/2013   Procedure: ARTERIOVENOUS (AV) FISTULA CREATION BRACHIAL-CEPHALIC;  Surgeon: Conrad Humboldt, MD;   Location: [place];  Service: Vascular;  Laterality: Left;  . DIRECT LARYNGOSCOPY N/A 08/17/2016   Procedure: DIRECT LARYNGOSCOPY WITH BIOPSY AND FROZEN SECTION;  Surgeon: Rozetta Nunnery, MD;  Location: [place];  Service: ENT;  Laterality: N/A;  . MULTIPLE EXTRACTIONS WITH ALVEOLOPLASTY N/A 08/31/2016   Procedure: Extraction of tooth #'s 2-10, 13-15, and 19-30 with alveoloplasty;  Surgeon: Lenn Cal, DDS;  Location: [place];  Service: Oral Surgery;  Laterality: N/A;    Family History:  Family History  Problem Relation Age of Onset  . Cancer - Lung Other   . Cancer Other     Social History:  reports that he has been smoking cigarettes. He has a 20.00 pack-year smoking history. He has never used smokeless tobacco. He reports that he does not drink alcohol or use drugs.  Additional Social History:  Alcohol / Drug Use Pain Medications: See MAR Prescriptions: See MAR Over the Counter: See MAR History of alcohol / drug use?: No history of alcohol / drug abuse(Pt denies. UDS is pending.)  CIWA: CIWA-Ar BP: 91/69 Pulse Rate: 95 COWS:    Allergies: No Known Allergies  Home Medications: (Not in a hospital admission)   OB/GYN Status:  No LMP for male patient.  General Assessment Data Location of Assessment: [facility] ED TTS Assessment: In system Is this a Tele or Face-to-Face Assessment?: Tele Assessment Is this an Initial Assessment or a Re-assessment for this encounter?: Initial Assessment Patient Accompanied by:: N/A Language Other than English: No Living Arrangements: Other (  Comment)(Alone. ) What gender do you identify as?: Male Marital status: Single Living Arrangements: Alone Can pt return to current living arrangement?: Yes Admission Status: Involuntary Petitioner: Family member Is patient capable of signing voluntary admission?: No Referral Source: Self/Family/Friend Insurance type: [employer].     Crisis Care Plan Living  Arrangements: Alone Legal Guardian: Mother(Bonnie [place], [phone]) Name of Psychiatrist: Dr. Adele Schilder.   Education Status Is patient currently in school?: No Is the patient employed, unemployed or receiving disability?: Receiving disability income  Risk to self with the past 6 months Suicidal Ideation: No(Pt denies. ) Has patient been a risk to self within the past 6 months prior to admission? : No(Pt denies. ) Suicidal Intent: No Has patient had any suicidal intent within the past 6 months prior to admission? : No Is patient at risk for suicide?: No Suicidal Plan?: No Has patient had any suicidal plan within the past 6 months prior to admission? : No Access to Means: No What has been your use of drugs/alcohol within the last 12 months?: UDS is pending. Previous Attempts/Gestures: No(Pt denies. ) How many times?: 0 Other Self Harm Risks: NA Triggers for Past Attempts: None known Intentional Self Injurious Behavior: None(Pt denies. ) Family Suicide History: No Recent stressful life event(s): Other (Comment)(Pt denies, stressors. ) Persecutory voices/beliefs?: No Depression: No(Pt denies. ) Depression Symptoms: (Pt denies. ) Substance abuse history and/or treatment for substance abuse?: No Suicide prevention information given to non-admitted patients: Not applicable  Risk to Others within the past 6 months Homicidal Ideation: Yes-Currently Present(Per IVC however pt denies. ) Does patient have any lifetime risk of violence toward others beyond the six months prior to admission? : No(Pt denies. ) Thoughts of Harm to Others: Yes-Currently Present(Per IVC however pt denies. ) Comment - Thoughts of Harm to Others: Per IVC, pt threatened to kill his mother.  Current Homicidal Intent: No Current Homicidal Plan: No Access to Homicidal Means: No Identified Victim: Per IVC, mother.  History of harm to others?: No(Pt denies. ) Assessment of Violence: None Noted Violent Behavior  Description: NA Does patient have access to weapons?: No(Pt denies. ) Criminal Charges Pending?: No Does patient have a court date: No Is patient on probation?: No  Psychosis Hallucinations: None noted(Pt denies. ) Delusions: None noted(Pt denies. )  Mental Status Report Appearance/Hygiene: In scrubs Eye Contact: Poor Motor Activity: Unremarkable Speech: Logical/coherent Level of Consciousness: Quiet/awake Mood: Irritable Affect: Flat Anxiety Level: None Thought Processes: Coherent, Relevant Judgement: Partial Orientation: Person, Place, Time Obsessive Compulsive Thoughts/Behaviors: None  Cognitive Functioning Concentration: Normal Memory: Recent Intact Is patient IDD: No Insight: Poor Impulse Control: Poor Appetite: Good Have you had any weight changes? : No Change Sleep: No Change Total Hours of Sleep: 8 Vegetative Symptoms: None  ADLScreening [facility] Assessment Services) Patient's cognitive ability adequate to safely complete daily activities?: Yes Patient able to express need for assistance with ADLs?: Yes Independently performs ADLs?: Yes (appropriate for developmental age)  Prior Inpatient Therapy Prior Inpatient Therapy: Yes Prior Therapy Dates: Pt is unsure.  Prior Therapy Facilty/Provider(s): UTA Reason for Treatment: UTA  Prior Outpatient Therapy Prior Outpatient Therapy: Yes Prior Therapy Dates: Current.  Prior Therapy Facilty/Provider(s): Dr. Adele Schilder.  Reason for Treatment: Medication management.  Does patient have an ACCT team?: No Does patient have Intensive In-House Services?  : No Does patient have [facility] services? : No Does patient have P4CC services?: No  ADL Screening (condition at time of admission) Patient's cognitive ability adequate to safely complete daily  activities?: Yes Is the patient deaf or have difficulty hearing?: No Does the patient have difficulty concentrating, remembering, or making decisions?: No Patient able to express  need for assistance with ADLs?: Yes Does the patient have difficulty dressing or bathing?: No Independently performs ADLs?: Yes (appropriate for developmental age) Does the patient have difficulty walking or climbing stairs?: No Weakness of Legs: None Weakness of Arms/Hands: None  Home Assistive Devices/Equipment Home Assistive Devices/Equipment: None    Abuse/Neglect Assessment (Assessment to be complete while patient is alone) Abuse/Neglect Assessment Can Be Completed: Yes Physical Abuse: Denies(Pt denies.) Verbal Abuse: Denies(Pt denies.) Sexual Abuse: Denies(Pt denies.) Exploitation of patient/patient's resources: Denies(Pt denies.) Self-Neglect: Denies(Pt denies.)     Advance Directives (For Healthcare) Does Patient Have a Medical Advance Directive?: No Would patient like information on creating a medical advance directive?: No - Patient declined          Disposition: Talbot Grumbling, NP recommends to be observed and reassessed by psychiatry. Disposition discussed with Freida Busman, RN.   Clinician attempted to contact PA to discussed pt's disposition. RN to discuss pt's disposition.    Disposition Initial Assessment Completed for this Encounter: Yes  This service was provided via telemedicine using a 2-way, interactive audio and video technology.  Names of all persons participating in this telemedicine service and their role in this encounter. Name: Kevin Deleon. Role: Patient.  Name: Vertell Novak, MS, [facility], [place]. Role: Counselor.          Vertell Novak 04/06/2019 9:48 PM     Vertell Novak, [place], [facility], [facility] Triage Specialist [phone]

## 2019-04-06 NOTE — ED Notes (Signed)
Received call from [facility]. Pt to be reassessed in the morning. They were unable to get in touch with Domenic Moras. Will attempt to call and let him know plan. Pt sleeping. Will tell him plan when he wakes up. Sitter at bedside.

## 2019-04-06 NOTE — ED Notes (Signed)
Pt advised he just went to restroom and did not provide sample. This EMT advised him the next time he could go to let staff know and provide sample.

## 2019-04-06 NOTE — ED Triage Notes (Addendum)
Patient brought in by the police for threatening to kill his mother (which pt denies). Patient was IVC'd by mother. Patient denies complaints and doesn't know why he is here. Pt is a dialysis pt (tues-thur-sat)

## 2019-04-06 NOTE — ED Notes (Signed)
Patient denies pain and is resting comfortably.  

## 2019-04-06 NOTE — B[facility] Counselor (Addendum)
Pt was IVC'd by his mother/legal guardian  Horris Latino [place], [phone]). Per IVC paperwork: "Respondent has been previously diagnosed with Schizoaffective Disorder. He has been prescribed medication for his mental health issues but the efficacy of the medicine is compromised by his Dialysis treatments. He has commitments (5) most recently in 2012 to [facility]. Family states that he is combative, refusing his medical treatments, he's reckless on is mopd and his threatened to kill his mother. Family states that he is under threat of eviction for violating smoking policies at his apartment. Family is concerned for safety as he continues to regress."    At 2102, clinician attempted to call pt's mother/legal guardian Angela Nevin, [place] petitioner to gather collateral information. Clinician left a HIPPA compliant voice message with call back information.    Vertell Novak, MS, [facility], [facility] Triage Specialist [phone].

## 2019-04-07 LAB — CBG MONITORING, ED
Glucose-Capillary: 122 mg/dL — ABNORMAL HIGH (ref 70–99)
Glucose-Capillary: 172 mg/dL — ABNORMAL HIGH (ref 70–99)
Glucose-Capillary: 64 mg/dL — ABNORMAL LOW (ref 70–99)
Glucose-Capillary: 174 mg/dL — ABNORMAL HIGH (ref 70–99)

## 2019-04-07 LAB — RAPID URINE DRUG SCREEN, HOSP PERFORMED
Amphetamines: NOT DETECTED
Barbiturates: NOT DETECTED
Benzodiazepines: NOT DETECTED
Cocaine: NOT DETECTED
Opiates: NOT DETECTED
Tetrahydrocannabinol: POSITIVE — AB

## 2019-04-07 NOTE — ED Provider Notes (Signed)
Emergency Medicine Observation Re-evaluation Note  Kevin Deleon is a 48 y.o. male, seen on rounds today.  Pt initially presented to the ED for complaints of IVC Pt IVC by his mother secondary to his homicidal ideation. Of note he is T/Th/Sat dialysis patient. Currently, the patient is sleeping. RN reports pt had uneventful overnight. He ate breakfast and went back to sleep. No complaints.  Physical Exam  BP (!) 118/59 (BP Location: Right Arm)   Pulse 95   Temp 97.7 F (36.5 C) (Oral)   Resp 20   Ht 5\' 11"  (1.803 m)   Wt 102.1 kg   SpO2 96%   BMI 31.38 kg/m  Physical Exam  PE: Constitutional: well-developed, well-nourished, no apparent distress HENT: normocephalic, atraumatic.  Cardiovascular: normal rate  Pulmonary/Chest: effort normal; symmetric chest rise Abdominal: Nondistended Musculoskeletal:  no edema Skin: warm and dry, no rash, no diaphoresis Psychiatric: normal mood and affect, normal behavior    ED Course / MDM   I have reviewed the labs performed to date as well as medications administered while in observation.  Recent changes in the last 24 hours include none.  Plan  Current plan as of yesterday at 9 PM is for observation and reassessment by psychiatry. No updates as of 10:05 AM 04/07/19  Patient is under full IVC at this time.   Cherre Robins, PA-C 04/07/19 [place], [place], DO 04/07/19 1047

## 2019-04-07 NOTE — ED Notes (Signed)
Pt.s breakfast has arrived.

## 2019-04-07 NOTE — ED Notes (Signed)
Pt. Given 2 warm blankets.

## 2019-04-07 NOTE — ED Notes (Signed)
[phone] pts mother Horris Latino

## 2019-04-07 NOTE — Consult Note (Signed)
Patient is psych cleared at this time. He denies Si/hi.avh. he also denies ever threatening mom. He reports a history of schizophrenia, and is compliant with his medication. He is under the care of Dr. Adele Schilder, and medication refills have been verified. He is alert and oriented, calm and cooperative but irritated. He reports being ready to go. Writer asked patient was he suicidal x 2 and patient responded "you already asked that'. It appears he has normal thought processes at this time. He does not appear to be responding to internal stimuli. Patient may discharge home, he does not meet criteria for IVC at this time. Will need to obtain collateral and duty to warn mother that patient is being released. Writer notified Danny from [facility] of discharge and need for collateral.

## 2019-04-07 NOTE — BH Assessment (Signed)
[facility] Assessment Progress Note   Patient's mother, Angela Nevin [phone],  was notified of patient's discharge.  She was concerned about how he would get home. Informed her that he could ride the bus for free. She stated, "he refuses to wear a mask." She stated that she would come to get him.  Asked her if he had threatened to kill her and she said "yes."  Asked her if he had ever tried to hurt her in the past, she said, "no." She stated that he just makes threats to kill her, but has never acted on him.  She appeared to be more concerned about his safety rather than her own.

## 2019-04-07 NOTE — ED Notes (Signed)
Lunch ordered 

## 2019-04-07 NOTE — ED Notes (Signed)
Spoke with mother, Angela Nevin.  She would like to get an FLT on the pt bc she states the pt needs to be placed in long term facility.  Mother to follow up with sw.

## 2019-04-07 NOTE — ED Notes (Signed)
Updated guardian, Angela Nevin, re pt status.

## 2019-04-07 NOTE — Discharge Instructions (Addendum)
Please return to the ER or call 911 if you have new or worsening thoughts of hurting yourself or anyone else.

## 2019-04-07 NOTE — ED Notes (Signed)
Pt.s sugar was 64. RN notified. Pt. Given a sprite. Will check sugar in an hour, as per requested by RN.

## 2019-04-07 NOTE — ED Notes (Signed)
Breakfast ordered 

## 2019-04-10 ENCOUNTER — Ambulatory Visit (HOSPITAL_COMMUNITY): Payer: Medicare Other | Admitting: Psychiatry

## 2019-04-19 ENCOUNTER — Encounter (HOSPITAL_COMMUNITY): Payer: Self-pay | Admitting: Psychiatry

## 2019-04-19 ENCOUNTER — Ambulatory Visit (INDEPENDENT_AMBULATORY_CARE_PROVIDER_SITE_OTHER): Payer: Medicare Other | Admitting: Psychiatry

## 2019-04-19 ENCOUNTER — Other Ambulatory Visit: Payer: Self-pay

## 2019-04-19 DIAGNOSIS — F250 Schizoaffective disorder, bipolar type: Secondary | ICD-10-CM | POA: Diagnosis not present

## 2019-04-19 MED ORDER — RISPERIDONE 3 MG PO TABS: 3.0000 mg | ORAL_TABLET | Freq: Every day | ORAL | 0 refills | Status: AC

## 2019-04-19 NOTE — Progress Notes (Signed)
Virtual Visit via Telephone Note  I connected with Kevin Deleon on 04/19/19 at  9:40 AM EST by telephone and verified that I am speaking with the correct person using two identifiers.   I discussed the limitations, risks, security and privacy concerns of performing an evaluation and management service by telephone and the availability of in person appointments. I also discussed with the patient that there may be a patient responsible charge related to this service. The patient expressed understanding and agreed to proceed.   History of Present Illness: Patient was evaluated by phone session.  His mother who has power of attorney is also close by.  Patient was recently seen in the emergency room because he threatened his mother.  However he does not meet criteria for IVC.  Patient was last seen in person in the March.  His mother is concerned that he is not keeping his place clean and he may evicted.  She believes that patient need a structured environment because he does not follow rules and regulations despite person comes twice a week to clean the house.  He is smoking and leaving cigarette butts everywhere.  I try to talk to patient but he was superficially cooperative and we respond everything I do not know.  When I asked why he was in the emergency room he said higher now and he denied ever been threatening to his mother.  Denies any hallucination, paranoia or any voices.  He denies any suicidal thoughts.  We have discussed in the past about Risperdal can start or Abilify Maintena injection but as per mother compliance is not the issue.  She believes it is personality that he gets agitated and irritable when someone tried to discipline him.  She is in the process of getting FL2 to paperwork so patient can move to group home.  Patient is also compliant with dialysis.  Patient denies any tremors shakes or any EPS.  He denies any feeling of hopelessness.  Mother denied using drugs and denies any  violence or aggression since he was seen in the emergency room.  Past Psychiatric History: Reviewed H/O psychiatric illness since age 58. Multiple inpatient due to mania, psychosis, delusions and non-compliance with medication. Tried Haldol but higher dose made zombie, Lithium (stopped due to renal insufficiency), amitriptyline and Abilify. Risperdal helped the most.   Recent Results (from the past 2160 hour(s))  Comprehensive metabolic panel     Status: Abnormal   Collection Time: 04/06/19  5:00 PM  Result Value Ref Range   Sodium 134 (L) 135 - 145 mmol/L   Potassium 3.9 3.5 - 5.1 mmol/L   Chloride 89 (L) 98 - 111 mmol/L   CO2 26 22 - 32 mmol/L   Glucose, Bld 217 (H) 70 - 99 mg/dL   BUN 24 (H) 6 - 20 mg/dL   Creatinine, Ser 4.99 (H) 0.61 - 1.24 mg/dL   Calcium 9.0 8.9 - 10.3 mg/dL   Total Protein 8.0 6.5 - 8.1 g/dL   Albumin 3.9 3.5 - 5.0 g/dL   AST 15 15 - 41 U/L   ALT 16 0 - 44 U/L   Alkaline Phosphatase 110 38 - 126 U/L   Total Bilirubin 0.4 0.3 - 1.2 mg/dL   GFR calc non Af Amer 13 (L) >60 mL/min   GFR calc Af Amer 15 (L) >60 mL/min   Anion gap 19 (H) 5 - 15    Comment: Performed at [facility], [address] [place], [place]  [address]  Ethanol     Status: None   Collection Time: 04/06/19  5:00 PM  Result Value Ref Range   Alcohol, Ethyl (B) <10 <10 mg/dL    Comment: (NOTE) Lowest detectable limit for serum alcohol is 10 mg/dL. For medical purposes only. Performed at [facility], [place] [address] [place], [place] [address]   Salicylate level     Status: None   Collection Time: 04/06/19  5:00 PM  Result Value Ref Range   Salicylate Lvl <0.2 2.8 - 30.0 mg/dL    Comment: Performed at [facility] [address] [place], [place] [address]  Acetaminophen level     Status: Abnormal   Collection Time: 04/06/19  5:00 PM  Result Value Ref Range   Acetaminophen (Tylenol), Serum <10 (L) 10 - 30 ug/mL    Comment: (NOTE) Therapeutic concentrations vary  significantly. A range of 10-30 ug/mL  may be an effective concentration for many patients. However, some  are best treated at concentrations outside of this range. Acetaminophen concentrations >150 ug/mL at 4 hours after ingestion  and >50 ug/mL at 12 hours after ingestion are often associated with  toxic reactions. Performed at [facility], [place] [address] [place], [place] [address]   cbc     Status: Abnormal   Collection Time: 04/06/19  5:00 PM  Result Value Ref Range   WBC 11.3 (H) 4.0 - 10.5 K/uL   RBC 3.64 (L) 4.22 - 5.81 MIL/uL   Hemoglobin 12.4 (L) 13.0 - 17.0 g/dL   HCT 37.3 (L) 39.0 - 52.0 %   MCV 102.5 (H) 80.0 - 100.0 fL   MCH 34.1 (H) 26.0 - 34.0 pg   MCHC 33.2 30.0 - 36.0 g/dL   RDW 13.8 11.5 - 15.5 %   Platelets 271 150 - 400 K/uL   nRBC 0.0 0.0 - 0.2 %    Comment: Performed at [facility], [place] [address] [place], [place] [address]  Hemoglobin A1c     Status: Abnormal   Collection Time: 04/06/19  5:00 PM  Result Value Ref Range   Hgb A1c MFr Bld 6.9 (H) 4.8 - 5.6 %    Comment: (NOTE) Pre diabetes:          5.7%-6.4% Diabetes:              >6.4% Glycemic control for   <7.0% adults with diabetes    Mean Plasma Glucose 151.33 mg/dL    Comment: Performed at [facility] [address] [place], [place] [address]  SARS CORONAVIRUS 2 (TAT 6-24 HRS) Nasopharyngeal Nasopharyngeal Swab     Status: None   Collection Time: 04/06/19  6:53 PM   Specimen: Nasopharyngeal Swab  Result Value Ref Range   SARS Coronavirus 2 NEGATIVE NEGATIVE    Comment: (NOTE) SARS-CoV-2 target nucleic acids are NOT DETECTED. The SARS-CoV-2 RNA is generally detectable in upper and lower respiratory specimens during the acute phase of infection. Negative results do not preclude SARS-CoV-2 infection, do not rule out co-infections with other pathogens, and should not be used as the sole basis for treatment or other patient management decisions. Negative results must be  combined with clinical observations, patient history, and epidemiological information. The expected result is Negative. Fact Sheet for Patients: SugarRoll.be Fact Sheet for Healthcare Providers: https://www.woods-mathews.com/ This test is not yet approved or cleared by the [place] FDA and  has been authorized for detection and/or diagnosis of SARS-CoV-2 by FDA  under an Emergency Use Authorization (EUA). This EUA will remain  in effect (meaning this test can be used) for the duration of the COVID-19 declaration under Section 56 4(b)(1) of the Act, 21 U.S.C. section 360bbb-3(b)(1), unless the authorization is terminated or revoked sooner. Performed at [facility], [place] [address], [place], [place] [address]   CBG monitoring, ED     Status: Abnormal   Collection Time: 04/07/19  6:41 AM  Result Value Ref Range   Glucose-Capillary 122 (H) 70 - 99 mg/dL   Comment 1 Notify RN    Comment 2 Document in Chart   CBG monitoring, ED     Status: Abnormal   Collection Time: 04/07/19  8:30 AM  Result Value Ref Range   Glucose-Capillary 172 (H) 70 - 99 mg/dL  Rapid urine drug screen (hospital performed)     Status: Abnormal   Collection Time: 04/07/19 10:45 AM  Result Value Ref Range   Opiates NONE DETECTED NONE DETECTED   Cocaine NONE DETECTED NONE DETECTED   Benzodiazepines NONE DETECTED NONE DETECTED   Amphetamines NONE DETECTED NONE DETECTED   Tetrahydrocannabinol POSITIVE (A) NONE DETECTED   Barbiturates NONE DETECTED NONE DETECTED    Comment: (NOTE) DRUG SCREEN FOR MEDICAL PURPOSES ONLY.  IF CONFIRMATION IS NEEDED FOR ANY PURPOSE, NOTIFY LAB WITHIN 5 DAYS. LOWEST DETECTABLE LIMITS FOR URINE DRUG SCREEN Drug Class                     Cutoff (ng/mL) Amphetamine and metabolites    1000 Barbiturate and metabolites    200 Benzodiazepine                 277 Tricyclics and metabolites     300 Opiates and metabolites         300 Cocaine and metabolites        300 THC                            50 Performed at [facility], [place] [address], [place], [place] [address]   CBG monitoring, ED     Status: Abnormal   Collection Time: 04/07/19 12:19 PM  Result Value Ref Range   Glucose-Capillary 64 (L) 70 - 99 mg/dL  CBG monitoring, ED     Status: Abnormal   Collection Time: 04/07/19  2:02 PM  Result Value Ref Range   Glucose-Capillary 174 (H) 70 - 99 mg/dL       Psychiatric Specialty Exam: Physical Exam  ROS  There were no vitals taken for this visit.There is no height or weight on file to calculate BMI.  General Appearance: Superficially cooperative.  Eye Contact:  NA  Speech:  Slow  Volume:  Decreased  Mood:  Irritable  Affect:  NA  Thought Process:  Descriptions of Associations: Intact  Orientation:  Full (Time, Place, and Person)  Thought Content:  Poverty of thought content.  No paranoia, hallucination.  Suicidal Thoughts:  No  Homicidal Thoughts:  No  Memory:  Immediate;   Fair Recent;   Fair Remote;   Fair  Judgement:  Fair  Insight:  Fair  Psychomotor Activity:  NA  Concentration:  Concentration: Fair and Attention Span: Fair  Recall:  [employer] of Knowledge:  Fair  Language:  Fair  Akathisia:  No  Handed:  Right  AIMS (if indicated):     Assets:  Desire for Improvement Housing Social Support  ADL's:  Intact  Cognition:  WNL  Sleep:   fair      Assessment and Plan: Schizoaffective disorder, bipolar type.  I reviewed recent visit from the emergency room including blood work results.  His hemoglobin A1c is 6.9.  He is compliant with medication and dialysis.  I do believe and agree with the mother that patient requires higher level of structure as he is not keeping up with his place clean.  Patient has upcoming appointment with his primary care physician Dr. London Pepper at [facility] in [place].  I recommend that from to be placed in a group home usually filled by  primary care physician however I am happy to fill the psychiatry portion if needed.  I told the mother that his primary care physician can call me if he has any question.  In the meantime we will keep the current dose of Risperdal 3 mg daily.  I discussed medication side effects and benefits to the patient and his mother.  I recommend to call us back if there is any issue, concern or worsening of the symptom.  Follow-up in 3 months.  Follow Up Instructions:    I discussed the assessment and treatment plan with the patient. The patient was provided an opportunity to ask questions and all were answered. The patient agreed with the plan and demonstrated an understanding of the instructions.   The patient was advised to call back or seek an in-person evaluation if the symptoms worsen or if the condition fails to improve as anticipated.  I provided 30 minutes of non-face-to-face time during this encounter.   Kathlee Nations, MD

## 2019-05-19 DEATH — deceased

## 2019-07-18 ENCOUNTER — Ambulatory Visit (HOSPITAL_COMMUNITY): Payer: Medicare Other | Admitting: Psychiatry

## 2019-07-19 ENCOUNTER — Ambulatory Visit (HOSPITAL_COMMUNITY): Payer: Medicare Other | Admitting: Psychiatry
